# Patient Record
Sex: Male | Born: 1960 | Race: Black or African American | Hispanic: No | Marital: Married | Smoking: Current every day smoker
Health system: Southern US, Community
[De-identification: ages and names within clinical notes are randomized; demographics above are authoritative.]

## PROBLEM LIST (undated history)

## (undated) DIAGNOSIS — G8929 Other chronic pain: Secondary | ICD-10-CM

## (undated) DIAGNOSIS — I6522 Occlusion and stenosis of left carotid artery: Secondary | ICD-10-CM

## (undated) DIAGNOSIS — I10 Essential (primary) hypertension: Secondary | ICD-10-CM

## (undated) DIAGNOSIS — M1990 Unspecified osteoarthritis, unspecified site: Secondary | ICD-10-CM

## (undated) DIAGNOSIS — M545 Low back pain, unspecified: Secondary | ICD-10-CM

## (undated) DIAGNOSIS — I639 Cerebral infarction, unspecified: Secondary | ICD-10-CM

## (undated) DIAGNOSIS — Z8739 Personal history of other diseases of the musculoskeletal system and connective tissue: Secondary | ICD-10-CM

## (undated) DIAGNOSIS — J45909 Unspecified asthma, uncomplicated: Secondary | ICD-10-CM

## (undated) DIAGNOSIS — E7800 Pure hypercholesterolemia, unspecified: Secondary | ICD-10-CM

---

## 1999-09-11 ENCOUNTER — Encounter: Payer: Self-pay | Admitting: Emergency Medicine

## 1999-09-11 ENCOUNTER — Emergency Department (HOSPITAL_COMMUNITY): Admission: EM | Admit: 1999-09-11 | Discharge: 1999-09-11 | Payer: Self-pay | Admitting: Emergency Medicine

## 2001-06-11 ENCOUNTER — Emergency Department (HOSPITAL_COMMUNITY): Admission: EM | Admit: 2001-06-11 | Discharge: 2001-06-11 | Payer: Self-pay

## 2001-06-24 ENCOUNTER — Emergency Department (HOSPITAL_COMMUNITY): Admission: EM | Admit: 2001-06-24 | Discharge: 2001-06-24 | Payer: Self-pay | Admitting: Emergency Medicine

## 2001-09-20 ENCOUNTER — Encounter: Admission: RE | Admit: 2001-09-20 | Discharge: 2001-09-24 | Payer: Self-pay | Admitting: Orthopaedic Surgery

## 2010-12-29 HISTORY — PX: MEDIAL COLLATERAL LIGAMENT REPAIR, KNEE: SHX2019

## 2017-11-28 DIAGNOSIS — I639 Cerebral infarction, unspecified: Secondary | ICD-10-CM

## 2017-11-28 HISTORY — DX: Cerebral infarction, unspecified: I63.9

## 2017-12-15 ENCOUNTER — Other Ambulatory Visit: Payer: Self-pay

## 2017-12-15 ENCOUNTER — Emergency Department (HOSPITAL_COMMUNITY): Payer: Self-pay

## 2017-12-15 ENCOUNTER — Inpatient Hospital Stay (HOSPITAL_COMMUNITY)
Admission: EM | Admit: 2017-12-15 | Discharge: 2017-12-18 | DRG: 063 | Disposition: A | Discharge status: Home / Self Care | Payer: Self-pay | Attending: Neurology | Admitting: Neurology

## 2017-12-15 DIAGNOSIS — G8321 Monoplegia of upper limb affecting right dominant side: Secondary | ICD-10-CM | POA: Diagnosis present

## 2017-12-15 DIAGNOSIS — F17210 Nicotine dependence, cigarettes, uncomplicated: Secondary | ICD-10-CM | POA: Diagnosis present

## 2017-12-15 DIAGNOSIS — Z8673 Personal history of transient ischemic attack (TIA), and cerebral infarction without residual deficits: Secondary | ICD-10-CM

## 2017-12-15 DIAGNOSIS — I10 Essential (primary) hypertension: Secondary | ICD-10-CM | POA: Diagnosis present

## 2017-12-15 DIAGNOSIS — I6522 Occlusion and stenosis of left carotid artery: Secondary | ICD-10-CM | POA: Diagnosis present

## 2017-12-15 DIAGNOSIS — I63232 Cerebral infarction due to unspecified occlusion or stenosis of left carotid arteries: Secondary | ICD-10-CM

## 2017-12-15 DIAGNOSIS — I639 Cerebral infarction, unspecified: Secondary | ICD-10-CM | POA: Diagnosis present

## 2017-12-15 DIAGNOSIS — Z91013 Allergy to seafood: Secondary | ICD-10-CM

## 2017-12-15 DIAGNOSIS — I6340 Cerebral infarction due to embolism of unspecified cerebral artery: Principal | ICD-10-CM | POA: Diagnosis present

## 2017-12-15 DIAGNOSIS — R29705 NIHSS score 5: Secondary | ICD-10-CM | POA: Diagnosis present

## 2017-12-15 DIAGNOSIS — R29810 Facial weakness: Secondary | ICD-10-CM | POA: Diagnosis present

## 2017-12-15 DIAGNOSIS — I959 Hypotension, unspecified: Secondary | ICD-10-CM | POA: Diagnosis not present

## 2017-12-15 DIAGNOSIS — Z716 Tobacco abuse counseling: Secondary | ICD-10-CM

## 2017-12-15 DIAGNOSIS — R471 Dysarthria and anarthria: Secondary | ICD-10-CM | POA: Diagnosis present

## 2017-12-15 DIAGNOSIS — E785 Hyperlipidemia, unspecified: Secondary | ICD-10-CM | POA: Diagnosis present

## 2017-12-15 HISTORY — DX: Cerebral infarction, unspecified: I63.9

## 2017-12-15 HISTORY — DX: Essential (primary) hypertension: I10

## 2017-12-15 LAB — I-STAT TROPONIN, ED: TROPONIN I, POC: 0 ng/mL (ref 0.00–0.08)

## 2017-12-15 LAB — PROTIME-INR
INR: 1.07
PROTHROMBIN TIME: 13.8 s (ref 11.4–15.2)

## 2017-12-15 LAB — I-STAT CHEM 8, ED
BUN: 10 mg/dL (ref 6–20)
Calcium, Ion: 1.2 mmol/L (ref 1.15–1.40)
Chloride: 96 mmol/L — ABNORMAL LOW (ref 101–111)
Creatinine, Ser: 1.6 mg/dL — ABNORMAL HIGH (ref 0.61–1.24)
Glucose, Bld: 112 mg/dL — ABNORMAL HIGH (ref 65–99)
HCT: 45 % (ref 39.0–52.0)
Hemoglobin: 15.3 g/dL (ref 13.0–17.0)
Potassium: 3.6 mmol/L (ref 3.5–5.1)
Sodium: 136 mmol/L (ref 135–145)
TCO2: 27 mmol/L (ref 22–32)

## 2017-12-15 LAB — CBC
HCT: 41.7 % (ref 39.0–52.0)
Hemoglobin: 14.9 g/dL (ref 13.0–17.0)
MCH: 29.8 pg (ref 26.0–34.0)
MCHC: 35.7 g/dL (ref 30.0–36.0)
MCV: 83.4 fL (ref 78.0–100.0)
Platelets: 337 10*3/uL (ref 150–400)
RBC: 5 MIL/uL (ref 4.22–5.81)
RDW: 13.4 % (ref 11.5–15.5)
WBC: 15.3 10*3/uL — ABNORMAL HIGH (ref 4.0–10.5)

## 2017-12-15 LAB — APTT: aPTT: 30 s (ref 24–36)

## 2017-12-15 LAB — CBG MONITORING, ED: Glucose-Capillary: 121 mg/dL — ABNORMAL HIGH (ref 65–99)

## 2017-12-15 NOTE — ED Provider Notes (Signed)
[facility]   CSN: [id number] Arrival date & time: 12/15/17  2331     History   Chief Complaint Chief Complaint  Patient presents with  . Code Stroke    HPI Jimmy Morales is a 56 y.o. male.  HPI  Patient evaluated at 23:40 for Code Stroke.  This a 56 year old male who presents with acute onset of facial droop, dysarthria, and right upper extremity numbness.  Last seen normal 45 minutes ago.  I was emergently called to triage given concerns for a acute stroke.  Patient reports a history of heart disease and hypertension.  He was at work earlier and got home when he noted difficulty speaking and right-sided facial droop.  Patient is able to provide full history and states that he was able to finish work without any difficulties.  Level 5 caveat for acuity of condition. Code stroke initiated immediately  No past medical history on file.  Patient Active Problem List   Diagnosis Date Noted  . 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  Social History Social History   Tobacco Use  . Smoking status: Not on file  Substance Use Topics  . Alcohol use: Not on file  . Drug use: Not on file     Allergies   Patient has no allergy information on record.   Review of Systems Review of Systems  Unable to perform ROS: Acuity of condition  Neurological: Positive for facial asymmetry, speech difficulty and numbness.     Physical Exam Updated Vital Signs Wt 79.8 kg (175 lb 14.8 oz)   Physical Exam  Constitutional: He is oriented to person, place, and time. He appears well-developed and well-nourished.  ABCs intact  HENT:  Head: Normocephalic.  Right-sided facial droop noted that spares the forehead  Eyes: Pupils are equal, round, and reactive to light.  Cardiovascular: Normal rate, regular rhythm  and normal heart sounds.  No murmur heard. Pulmonary/Chest: Effort normal. No respiratory distress.  Abdominal: Soft. There is no tenderness.  Musculoskeletal: He exhibits no edema.  Neurological: He is alert and oriented to person, place, and time.  Right-sided facial droop that spares the forehead, dysarthria noted, drift noted in the right upper extremity with 4+ out of 5 grip strength and proximal muscle strength  Skin: Skin is warm and dry.  Psychiatric: He has a normal mood and affect.  Nursing note and vitals reviewed.    ED Treatments / Results  Labs (all labs ordered are listed, but only abnormal results are displayed) Labs Reviewed  CBC - Abnormal; Notable for the following components:      Result Value   WBC 15.3 (*)    All other components within normal limits  DIFFERENTIAL - Abnormal; Notable for the following components:   Neutro Abs 10.5 (*)    Monocytes Absolute 1.5 (*)    All other components within normal limits  COMPREHENSIVE METABOLIC PANEL - Abnormal; Notable for the following components:   Sodium 133 (*)    Chloride 99 (*)    Glucose, Bld 115 (*)    Creatinine, Ser 1.75 (*)    ALT 14 (*)    GFR calc non Af Amer 41 (*)    GFR calc Af Amer 48 (*)    All other components within normal limits  CBG  MONITORING, ED - Abnormal; Notable for the following components:   Glucose-Capillary 121 (*)    All other components within normal limits  I-STAT CHEM 8, ED - Abnormal; Notable for the following components:   Chloride 96 (*)    Creatinine, Ser 1.60 (*)    Glucose, Bld 112 (*)    All other components within normal limits  PROTIME-INR  APTT  HIV ANTIBODY (ROUTINE TESTING)  HEMOGLOBIN A1C  LIPID PANEL  I-STAT TROPONIN, ED  CBG MONITORING, ED    EKG  EKG Interpretation None      ED ECG REPORT   Date: 12/16/2017  Rate: 116  Rhythm: sinus tachycardia  QRS Axis: right  Intervals: normal  ST/T Wave abnormalities: normal  Conduction  Disutrbances:none  Narrative Interpretation:   Old EKG Reviewed: none available  I have personally reviewed the EKG tracing and agree with the computerized printout as noted.  Radiology Ct Head Code Stroke Wo Contrast  Result Date: 12/16/2017 CLINICAL DATA:  Code stroke. Right-sided weakness. Last seen well 09:20 p.m. EXAM: CT HEAD WITHOUT CONTRAST TECHNIQUE: Contiguous axial images were obtained from the base of the skull through the vertex without intravenous contrast. COMPARISON:  None. FINDINGS: Brain: No mass lesion or acute hemorrhage. No focal hypoattenuation of the basal ganglia or cortex to indicate infarcted tissue. No hydrocephalus or age advanced atrophy. Hypoattenuation in the left frontal lobe and left parietal lobe are consistent with old infarcts. There is bilateral basal ganglia mineralization. Vascular: No hyperdense vessel. No advanced atherosclerotic calcification of the arteries at the skull base. Skull: Normal visualized skull base, calvarium and extracranial soft tissues. Sinuses/Orbits: No sinus fluid levels or advanced mucosal thickening. No mastoid effusion. Normal orbits. ASPECTS [facility] Stroke Program Early CT Score) - Ganglionic level infarction (caudate, lentiform nuclei, internal capsule, insula, M1-M3 cortex): 7 - Supraganglionic infarction (M4-M6 cortex): 3 Total score (0-10 with 10 being normal): 10 IMPRESSION: 1. No acute hemorrhage or mass effect. 2. 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 3. ASPECTS is 10. I communicated these results directly to Dr. Laurence Slate at 11:58 pmon 12/18/2018by telephone. Electronically Signed   By: Deatra Robinson M.D.   On: 12/16/2017 00:04    Procedures Procedures (including critical care time)  CRITICAL CARE Performed by: Shon Baton   Total critical care time: 25 minutes  Critical care time was exclusive of separately billable procedures and treating other patients.  Critical care was necessary to treat or prevent imminent  or life-threatening deterioration.  Critical care was time spent personally by me on the following activities: development of treatment plan with patient and/or surrogate as well as nursing, discussions with consultants, evaluation of patient's response to treatment, examination of patient, obtaining history from patient or surrogate, ordering and performing treatments and interventions, ordering and review of laboratory studies, ordering and review of radiographic studies, pulse oximetry and re-evaluation of patient's condition.   Medications Ordered in ED Medications  alteplase (ACTIVASE) 1 mg/mL infusion 72 mg (not administered)    Followed by  0.9 %  sodium chloride infusion (not administered)   stroke: mapping our early stages of recovery book (not administered)  0.9 %  sodium chloride infusion (not administered)  acetaminophen (TYLENOL) tablet 650 mg (not administered)    Or  acetaminophen (TYLENOL) solution 650 mg (not administered)    Or  acetaminophen (TYLENOL) suppository 650 mg (not administered)  senna-docusate (Senokot-S) tablet 1 tablet (not administered)  pantoprazole (PROTONIX) injection 40 mg (not administered)  labetalol (NORMODYNE,TRANDATE) injection 10 mg (not  administered)     Initial Impression / Assessment and Plan / ED Course  I have reviewed the triage vital signs and the nursing notes.  Pertinent labs & imaging results that were available during my care of the patient were reviewed by me and considered in my medical decision making (see chart for details).     Patient presents with concerns for acute stroke.  He is well within the TPA window.  Code stroke was initiated.  Evaluated by neurology in the CT scanner.  TPA was initiated with concerns for acute stroke.  His ABCs are intact at this time.  Anticipate admission to the neuro ICU for frequent neuro checks.  Final Clinical Impressions(s) / ED Diagnoses   Final diagnoses:  Cerebrovascular accident  (CVA), unspecified mechanism [facility](HCC)    ED Discharge Orders    None       Sevastian Witczak, Mayer Maskerourtney F, MD 12/16/17 0028

## 2017-12-15 NOTE — ED Triage Notes (Addendum)
45 minutes ago, pt began experiencing right sided weakness, including clear facial droop.  No hx of stroke.  Provider notified and in triage.

## 2017-12-16 ENCOUNTER — Inpatient Hospital Stay (HOSPITAL_COMMUNITY): Payer: Self-pay

## 2017-12-16 DIAGNOSIS — I639 Cerebral infarction, unspecified: Secondary | ICD-10-CM | POA: Diagnosis present

## 2017-12-16 DIAGNOSIS — I361 Nonrheumatic tricuspid (valve) insufficiency: Secondary | ICD-10-CM

## 2017-12-16 DIAGNOSIS — I63232 Cerebral infarction due to unspecified occlusion or stenosis of left carotid arteries: Secondary | ICD-10-CM

## 2017-12-16 DIAGNOSIS — E785 Hyperlipidemia, unspecified: Secondary | ICD-10-CM

## 2017-12-16 DIAGNOSIS — F17200 Nicotine dependence, unspecified, uncomplicated: Secondary | ICD-10-CM

## 2017-12-16 DIAGNOSIS — I959 Hypotension, unspecified: Secondary | ICD-10-CM

## 2017-12-16 DIAGNOSIS — I6522 Occlusion and stenosis of left carotid artery: Secondary | ICD-10-CM

## 2017-12-16 LAB — ECHOCARDIOGRAM COMPLETE
AOASC: 37 cm
E decel time: 232 msec
EERAT: 7.97
FS: 34 % (ref 28–44)
IVS/LV PW RATIO, ED: 0.99
LA ID, A-P, ES: 33 mm
LA diam end sys: 33 mm
LA diam index: 1.64 cm/m2
LA vol A4C: 46.1 ml
LDCA: 3.8 cm2
LV E/e' medial: 7.97
LV PW d: 9.66 mm — AB (ref 0.6–1.1)
LV e' LATERAL: 12 cm/s
LVEEAVG: 7.97
LVOTD: 22 mm
MV Dec: 232
MV Peak grad: 4 mmHg
MV pk A vel: 114 m/s
MVPKEVEL: 95.6 m/s
RV LATERAL S' VELOCITY: 9.9 cm/s
RV TAPSE: 21.9 mm
TDI e' lateral: 12
TDI e' medial: 8.49
Weight: 2814.83 oz

## 2017-12-16 LAB — BASIC METABOLIC PANEL
ANION GAP: 7 (ref 5–15)
BUN: 11 mg/dL (ref 6–20)
CHLORIDE: 103 mmol/L (ref 101–111)
CO2: 25 mmol/L (ref 22–32)
Calcium: 9 mg/dL (ref 8.9–10.3)
Creatinine, Ser: 1.34 mg/dL — ABNORMAL HIGH (ref 0.61–1.24)
GFR calc non Af Amer: 57 mL/min — ABNORMAL LOW (ref >60)
Glucose, Bld: 92 mg/dL (ref 65–99)
POTASSIUM: 4 mmol/L (ref 3.5–5.1)
Sodium: 135 mmol/L (ref 135–145)

## 2017-12-16 LAB — HEMOGLOBIN A1C
Hgb A1c MFr Bld: 5.6 % (ref 4.8–5.6)
Mean Plasma Glucose: 114.02 mg/dL

## 2017-12-16 LAB — COMPREHENSIVE METABOLIC PANEL
ALBUMIN: 4 g/dL (ref 3.5–5.0)
ALK PHOS: 65 U/L (ref 38–126)
ALT: 14 U/L — AB (ref 17–63)
AST: 24 U/L (ref 15–41)
Anion gap: 9 (ref 5–15)
BILIRUBIN TOTAL: 0.9 mg/dL (ref 0.3–1.2)
BUN: 10 mg/dL (ref 6–20)
CALCIUM: 9.6 mg/dL (ref 8.9–10.3)
CO2: 25 mmol/L (ref 22–32)
CREATININE: 1.75 mg/dL — AB (ref 0.61–1.24)
Chloride: 99 mmol/L — ABNORMAL LOW (ref 101–111)
GFR calc Af Amer: 48 mL/min — ABNORMAL LOW (ref >60)
GFR calc non Af Amer: 41 mL/min — ABNORMAL LOW (ref >60)
GLUCOSE: 115 mg/dL — AB (ref 65–99)
Potassium: 3.6 mmol/L (ref 3.5–5.1)
SODIUM: 133 mmol/L — AB (ref 135–145)
Total Protein: 6.9 g/dL (ref 6.5–8.1)

## 2017-12-16 LAB — RAPID URINE DRUG SCREEN, HOSP PERFORMED
AMPHETAMINES: NOT DETECTED
BENZODIAZEPINES: NOT DETECTED
Barbiturates: NOT DETECTED
Cocaine: NOT DETECTED
OPIATES: NOT DETECTED
Tetrahydrocannabinol: NOT DETECTED

## 2017-12-16 LAB — DIFFERENTIAL
Basophils Absolute: 0 10*3/uL (ref 0.0–0.1)
Basophils Relative: 0 %
EOS ABS: 0.2 10*3/uL (ref 0.0–0.7)
Eosinophils Relative: 1 %
LYMPHS PCT: 20 %
Lymphs Abs: 3.1 10*3/uL (ref 0.7–4.0)
Monocytes Absolute: 1.5 10*3/uL — ABNORMAL HIGH (ref 0.1–1.0)
Monocytes Relative: 10 %
NEUTROS ABS: 10.5 10*3/uL — AB (ref 1.7–7.7)
NEUTROS PCT: 69 %

## 2017-12-16 LAB — HIV ANTIBODY (ROUTINE TESTING W REFLEX): HIV Screen 4th Generation wRfx: NONREACTIVE

## 2017-12-16 LAB — LIPID PANEL
CHOL/HDL RATIO: 7.5 ratio
CHOLESTEROL: 181 mg/dL (ref 0–200)
HDL: 24 mg/dL — ABNORMAL LOW (ref >40)
LDL CALC: 88 mg/dL (ref 0–99)
TRIGLYCERIDES: 343 mg/dL — AB (ref <150)
VLDL: 69 mg/dL — AB (ref 0–40)

## 2017-12-16 LAB — MRSA PCR SCREENING: MRSA by PCR: NEGATIVE

## 2017-12-16 LAB — TSH: TSH: 0.909 u[IU]/mL (ref 0.350–4.500)

## 2017-12-16 LAB — VITAMIN B12: VITAMIN B 12: 402 pg/mL (ref 180–914)

## 2017-12-16 MED ORDER — PANTOPRAZOLE SODIUM 40 MG PO TBEC
40.0000 mg | DELAYED_RELEASE_TABLET | Freq: Every day | ORAL | Status: DC
  Administered 2017-12-16 – 2017-12-18 (×3): 40 mg via ORAL
  Filled 2017-12-16 (×3): qty 1

## 2017-12-16 MED ORDER — ORAL CARE MOUTH RINSE: 15.0000 mL | Freq: Two times a day (BID) | OROMUCOSAL | Status: DC

## 2017-12-16 MED ORDER — SODIUM CHLORIDE 0.9 % IV SOLN: 50.0000 mL | Freq: Once | INTRAVENOUS | Status: DC

## 2017-12-16 MED ORDER — ACETAMINOPHEN 325 MG PO TABS: 650.0000 mg | ORAL_TABLET | ORAL | Status: DC | PRN

## 2017-12-16 MED ORDER — SODIUM CHLORIDE 0.9 % IV SOLN
INTRAVENOUS | Status: DC
  Administered 2017-12-16 – 2017-12-18 (×4): via INTRAVENOUS

## 2017-12-16 MED ORDER — ACETAMINOPHEN 160 MG/5ML PO SOLN
650.0000 mg | ORAL | Status: DC | PRN
Start: 2017-12-16 — End: 2017-12-17

## 2017-12-16 MED ORDER — ATORVASTATIN CALCIUM 80 MG PO TABS
80.0000 mg | ORAL_TABLET | Freq: Every day | ORAL | Status: DC
  Administered 2017-12-17: 80 mg via ORAL
  Filled 2017-12-16: qty 1

## 2017-12-16 MED ORDER — ACETAMINOPHEN 650 MG RE SUPP
650.0000 mg | RECTAL | Status: DC | PRN
Start: 2017-12-16 — End: 2017-12-17

## 2017-12-16 MED ORDER — SENNOSIDES-DOCUSATE SODIUM 8.6-50 MG PO TABS: 1.0000 | ORAL_TABLET | Freq: Every evening | ORAL | Status: DC | PRN

## 2017-12-16 MED ORDER — IOPAMIDOL (ISOVUE-370) INJECTION 76%
INTRAVENOUS | Status: AC
  Administered 2017-12-17: 50 mL
  Filled 2017-12-16: qty 50

## 2017-12-16 MED ORDER — LABETALOL HCL 5 MG/ML IV SOLN: 10.0000 mg | INTRAVENOUS | Status: DC | PRN

## 2017-12-16 MED ORDER — STROKE: EARLY STAGES OF RECOVERY BOOK
Freq: Once | Status: AC
  Administered 2017-12-16: 04:00:00
  Filled 2017-12-16: qty 1

## 2017-12-16 MED ORDER — PANTOPRAZOLE SODIUM 40 MG IV SOLR: 40.0000 mg | Freq: Every day | INTRAVENOUS | Status: DC

## 2017-12-16 MED ORDER — ALTEPLASE (STROKE) FULL DOSE INFUSION
0.9000 mg/kg | Freq: Once | INTRAVENOUS | Status: AC
  Administered 2017-12-16: 72 mg via INTRAVENOUS
  Filled 2017-12-16: qty 100

## 2017-12-16 NOTE — Progress Notes (Signed)
Brennan BaileyDavid Zill is a 56 y.o. Male coming from home via private vehicle with acute onset of Right arm numbness, Right side facial droop, and slurred speech. Pt LKW at 2130 when eating dinner. Pt noticed numbness approx 2250. Spouse drove pt to ED, code stroke called in triage. NIH 5, CBG 121. TPA started at 0002 after IV obtained in CT

## 2017-12-16 NOTE — Progress Notes (Signed)
  Echocardiogram 2D Echocardiogram has been performed.  Shylin Keizer G Litzy Dicker 12/16/2017, 11:28 AM

## 2017-12-16 NOTE — Progress Notes (Signed)
OT Cancellation Note  Patient Details Name: Jimmy CrumbleDavid B Morales MRN: [id number] DOB: 02/06/1961   Cancelled Treatment:    Reason Eval/Treat Not Completed: Patient not medically ready. Pt with strict bedrest orders. Will await increase in activity orders prior to initiating OT evaluation. Thank you for this referral!  Doristine Sectionharity A Bralyn Espino, MS OTR/L  Pager: [phone]   Loredana Medellin A Zarahi Fuerst 12/16/2017, 7:40 AM

## 2017-12-16 NOTE — Evaluation (Signed)
Speech Language Pathology Evaluation Patient Details Name: Jimmy CrumbleDavid B [place]de[place] MRN: [id number] DOB: 03/23/1961 Today's Date: 12/16/2017 Time: 0930-1000 SLP Time Calculation (min) (ACUTE ONLY): 30 min  Problem List:  Patient Active Problem List   Diagnosis Date Noted  . 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 Past Surgical History:  HPI:  56 y.o. male AA RH male with PMH of hypertension, tobacco use presents with sudden onset right-sided weakness and slurred speech. Received TPA. CT angiogram was not done due to elevated creatinine. However stat MRI and MRA brain was obtained which showed left  frontal infarct as well as punctate bilateral parietal infarcts. Also demonstrates older left frontal and parietal lobe infarcts.    Assessment / Plan / Recommendation Clinical Impression  Pt demosntrates a mild to moderate dysarthria and aphasia with primary receptive deficits. Pt able to repeat easily, improves intelligibility with cues for louder, overarticulated speech. Follows one step commands easily, but cannot follow prepositions and added complexity in language. Mild difficulty naming. Recommend CIR at d/c. Fill follow for functional communication.     SLP Assessment  SLP Recommendation/Assessment: Patient needs continued Speech Lanaguage Pathology Services SLP Visit Diagnosis: Dysarthria and anarthria (R47.1);Aphasia (R47.01)    Follow Up Recommendations  Inpatient Rehab    Frequency and Duration min 2x/week  2 weeks      SLP Evaluation Cognition  Overall Cognitive Status: Within Functional Limits for tasks assessed Arousal/Alertness: Awake/alert Orientation Level: Oriented to person;Oriented to place;Disoriented to time;Oriented to situation Attention: Selective;Alternating Selective Attention: Appears intact Alternating Attention: Appears intact Problem Solving: Appears intact Safety/Judgment: Appears intact        Comprehension  Auditory Comprehension Overall Auditory Comprehension: Impaired Yes/No Questions: Not tested Commands: Impaired One Step Basic Commands: 75-100% accurate Two Step Basic Commands: 50-74% accurate Multistep Basic Commands: 50-74% accurate Complex Commands: 0-24% accurate Visual Recognition/Discrimination Discrimination: Within Function Limits    Expression Verbal Expression Overall Verbal Expression: Impaired Initiation: No impairment Automatic Speech: Month of year;Counting;Social Response;Name Level of Generative/Spontaneous Verbalization: Phrase Repetition: No impairment Naming: Impairment Responsive: Not tested Confrontation: Impaired Convergent: Not tested Divergent: Not tested Verbal Errors: Aware of errors Pragmatics: No impairment Interfering Components: Speech intelligibility Effective Techniques: Phonemic cues Written Expression Dominant Hand: Right Written Expression: Not tested   Oral / Motor  Oral Motor/Sensory Function Overall Oral Motor/Sensory Function: Mild impairment Facial ROM: Reduced right;Suspected CN VII (facial) dysfunction Facial Symmetry: Abnormal symmetry right;Suspected CN VII (facial) dysfunction Facial Strength: Reduced right;Suspected CN VII (facial) dysfunction Lingual ROM: Within Functional Limits;Other (Comment)(decreased lingual coordination/speed) Lingual Symmetry: Within Functional Limits Lingual Strength: Within Functional Limits Lingual Sensation: Within Functional Limits Velum: Within Functional Limits Mandible: Within Functional Limits Motor Speech Overall Motor Speech: Impaired Respiration: Within functional limits Phonation: Normal Resonance: Within functional limits Articulation: Impaired Level of Impairment: Phrase Intelligibility: Intelligibility reduced Word: 50-74% accurate Phrase: 50-74% accurate Motor Planning: Witnin functional limits Motor Speech Errors: Aware   GO                   Harlon DittyBonnie  Pati Thinnes, MA CCC-SLP [phone]  Claudine MoutonDeBlois, Coleson Kant Caroline 12/16/2017, 10:12 AM

## 2017-12-16 NOTE — Progress Notes (Signed)
Pharmacist Code Stroke Response  Notified to mix tPA at 2352 on 12/15/17 by Dr. Laurence SlateAroor Delivered tPA to RN at 2358 on 12/15/17  Abran DukeLedford, Jahson Emanuele 12/16/17 12:02 AM

## 2017-12-16 NOTE — Plan of Care (Signed)
Will continue to monitor for s/s of aspiration.

## 2017-12-16 NOTE — Progress Notes (Signed)
Rehab Admissions Coordinator Note:  Patient was screened by Trish MageLogue, Brailyn Delman M for appropriateness for an Inpatient Acute Rehab Consult. Noted SLP recommending inpatient rehab.  Will await PT/OT evaluations and recommendations to determine most appropriate rehab venue.  Trish MageLogue, Adil Tugwell M 12/16/2017, 3:03 PM  I can be reached at [phone].

## 2017-12-16 NOTE — H&P (Addendum)
Chief Complaint: Left side weakness  History obtained from: Patient and Chart     HPI:                                                                                                                                       Jimmy Morales is an 56 y.o. male AA RH male with PMH of hypertension, tobacco use presents with sudden onset right-sided weakness and slurred speech.  Patient recalls coming back from work at 9:20 PM where he was perfectly fine. Somewhere around 10:30 to 11 PM after the patient had dinner he noticed his speech became slurred and felt numbness and weakness of his right side. His spouse drove him to the hospital and on triage was stroke alerted. His blood pressure was 120 systolic. His NIHSS was 5. 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 Patient receive TPA at 12:02 AM  Date last known well: 12/15/2017 Time last known well: 10:50 PM tPA Given: yes NIHSS: 5 Baseline MRS 0  PMH Hypertension   FH No strokes in family at young age  SH Current smoker, denies illicit drugs   Allergies:  Allergies  Allergen Reactions  . Shellfish Allergy Shortness Of Breath and Swelling    Medications:                                                                                                                        I reviewed home medications   ROS:                                                                                                                                     14 systems reviewed and negative except above    Examination:  General: Appears well-developed and well-nourished.  Psych: Affect appropriate to situation Eyes: No scleral injection HENT: No OP obstrucion Head: Normocephalic.  Cardiovascular: Normal rate and regular rhythm.  Respiratory: Effort normal and breath sounds normal to anterior ascultation GI:  Soft.  No distension. There is no tenderness.  Skin: WDI   Neurological Examination Mental Status: Alert, oriented, thought content appropriate.  Speech fluent without evidence of aphasia. Dysarthric Able to follow 3 step commands without difficulty. Cranial Nerves: II: Visual fields grossly normal,  III,IV, VI: ptosis not present, extra-ocular motions intact bilaterally, pupils equal, round, reactive to light and accommodation V,VII: Right lower facial droop VIII: hearing normal bilaterally IX,X: uvula rises symmetrically XI: bilateral shoulder shrug XII: midline tongue extension Motor: Right : Upper extremity   4/5    Left:     Upper extremity   5/5  Lower extremity   4+/5     Lower extremity   5/5 Tone and bulk:normal tone throughout; no atrophy noted Sensory: Reduced sensation to light touch and temperature over the face, arm and leg on the right side Deep Tendon Reflexes: 2+ and symmetric throughout Plantars: Right: downgoing   Left: downgoing Cerebellar: normal finger-to-nose, normal rapid alternating movements and normal heel-to-shin test Gait: normal gait and station     Lab Results: Basic Metabolic Panel: Recent Labs  Lab 12/15/17 2335 12/15/17 2351  NA 133* 136  K 3.6 3.6  CL 99* 96*  CO2 25  --   GLUCOSE 115* 112*  BUN 10 10  CREATININE 1.75* 1.60*  CALCIUM 9.6  --     CBC: Recent Labs  Lab 12/15/17 2335 12/15/17 2351  WBC 15.3*  --   NEUTROABS 10.5*  --   HGB 14.9 15.3  HCT 41.7 45.0  MCV 83.4  --   PLT 337  --     Coagulation Studies: Recent Labs    12/15/17 2335  LABPROT 13.8  INR 1.07    Imaging: Mr Maxine GlennMra Neck Wo Contrast  Result Date: 12/16/2017 CLINICAL DATA:  Acute onset facial droop, dysarthria and right upper extremity numbness. EXAM: MRI HEAD WITHOUT CONTRAST MRA HEAD WITHOUT CONTRAST MRA NECK WITHOUT CONTRAST TECHNIQUE: Multiplanar, multiecho pulse sequences of the brain and surrounding structures were obtained without  intravenous contrast. Angiographic images of the Circle of Willis were obtained using MRA technique without intravenous contrast. Angiographic images of the neck were obtained using MRA technique without intravenous contrast. Carotid stenosis measurements (when applicable) are obtained utilizing NASCET criteria, using the distal internal carotid diameter as the denominator. COMPARISON:  Head CT 12/15/2017 FINDINGS: MRI HEAD FINDINGS Brain: The midline structures are normal. There is a small focus of diffusion restriction pain in the peripheral left frontal lobe, along the precentral gyrus. There are additional punctate foci within both superior parietal lobes. There are old infarcts of the left frontal lobe left parietal lobe. No mass lesion. No chronic microhemorrhage or cerebral amyloid angiopathy. No hydrocephalus, age advanced atrophy or lobar predominant volume loss. No dural abnormality or extra-axial collection. Skull and upper cervical spine: The visualized skull base, calvarium, upper cervical spine and extracranial soft tissues are normal. Sinuses/Orbits: No fluid levels or advanced mucosal thickening. No mastoid effusion. Normal orbits. MRA HEAD FINDINGS Intracranial internal carotid arteries: Normal. Anterior cerebral arteries: The azygos variant anterior cerebral artery, arising from the left A1 segment. There is a diminutive right A1 segment. Middle cerebral arteries: There is a short segment focal stenosis of the distal M 1 segment of the left middle cerebral  artery. The right MCA is normal. Posterior communicating arteries: Present bilaterally. Posterior cerebral arteries: Normal. Basilar artery: Normal. Vertebral arteries: Codominant. Normal. Superior cerebellar arteries: Normal. Anterior inferior cerebellar arteries: Normal. Posterior inferior cerebellar arteries: Normal. MRA NECK FINDINGS Right carotid system: Normal course and caliber without stenosis or evidence of dissection. Left carotid  system: There is loss of the normal flow related enhancement at the origin of the left internal carotid artery, extending over a short segment. There is present flow related enhancement of the distal left ICA. Vertebral arteries: Codominant. Vertebral artery origins are normal. Vertebral arteries are normal in course and caliber to the vertebrobasilar confluence without stenosis or evidence of dissection. IMPRESSION: 1. Multiple punctate foci of acute ischemia within the left frontal lobe and bilateral superior parietal lobes. The largest area of ischemia is along the left precentral gyrus. No hemorrhage or mass effect. 2. Loss of the normal flow related enhancement is of the proximal left internal carotid artery, likely indicating occlusion or critical stenosis. MR angiography may overestimate the degree of stenosis. CT angiography or conventional angiography may provide more accurate assessment. 3. 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 Critical Value/emergent results were called by telephone at the time of interpretation on 12/16/2017 at 3:17 am to Dr. Arther Dames , who verbally acknowledged these results. Electronically Signed   By: Deatra Robinson M.D.   On: 12/16/2017 03:19   Mr Brain Wo Contrast  Result Date: 12/16/2017 CLINICAL DATA:  Acute onset facial droop, dysarthria and right upper extremity numbness. EXAM: MRI HEAD WITHOUT CONTRAST MRA HEAD WITHOUT CONTRAST MRA NECK WITHOUT CONTRAST TECHNIQUE: Multiplanar, multiecho pulse sequences of the brain and surrounding structures were obtained without intravenous contrast. Angiographic images of the Circle of Willis were obtained using MRA technique without intravenous contrast. Angiographic images of the neck were obtained using MRA technique without intravenous contrast. Carotid stenosis measurements (when applicable) are obtained utilizing NASCET criteria, using the distal internal carotid diameter as the denominator. COMPARISON:  Head CT  12/15/2017 FINDINGS: MRI HEAD FINDINGS Brain: The midline structures are normal. There is a small focus of diffusion restriction pain in the peripheral left frontal lobe, along the precentral gyrus. There are additional punctate foci within both superior parietal lobes. There are old infarcts of the left frontal lobe left parietal lobe. No mass lesion. No chronic microhemorrhage or cerebral amyloid angiopathy. No hydrocephalus, age advanced atrophy or lobar predominant volume loss. No dural abnormality or extra-axial collection. Skull and upper cervical spine: The visualized skull base, calvarium, upper cervical spine and extracranial soft tissues are normal. Sinuses/Orbits: No fluid levels or advanced mucosal thickening. No mastoid effusion. Normal orbits. MRA HEAD FINDINGS Intracranial internal carotid arteries: Normal. Anterior cerebral arteries: The azygos variant anterior cerebral artery, arising from the left A1 segment. There is a diminutive right A1 segment. Middle cerebral arteries: There is a short segment focal stenosis of the distal M 1 segment of the left middle cerebral artery. The right MCA is normal. Posterior communicating arteries: Present bilaterally. Posterior cerebral arteries: Normal. Basilar artery: Normal. Vertebral arteries: Codominant. Normal. Superior cerebellar arteries: Normal. Anterior inferior cerebellar arteries: Normal. Posterior inferior cerebellar arteries: Normal. MRA NECK FINDINGS Right carotid system: Normal course and caliber without stenosis or evidence of dissection. Left carotid system: There is loss of the normal flow related enhancement at the origin of the left internal carotid artery, extending over a short segment. There is present flow related enhancement of the distal left ICA. Vertebral arteries: Codominant. Vertebral artery origins are  normal. Vertebral arteries are normal in course and caliber to the vertebrobasilar confluence without stenosis or evidence of  dissection. IMPRESSION: 1. Multiple punctate foci of acute ischemia within the left frontal lobe and bilateral superior parietal lobes. The largest area of ischemia is along the left precentral gyrus. No hemorrhage or mass effect. 2. Loss of the normal flow related enhancement is of the proximal left internal carotid artery, likely indicating occlusion or critical stenosis. MR angiography may overestimate the degree of stenosis. CT angiography or conventional angiography may provide more accurate assessment. 3. 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 Critical Value/emergent results were called by telephone at the time of interpretation on 12/16/2017 at 3:17 am to Dr. Arther Dames , who verbally acknowledged these results. Electronically Signed   By: Deatra Robinson M.D.   On: 12/16/2017 03:19   Mr Maxine Glenn Head Wo Contrast  Result Date: 12/16/2017 CLINICAL DATA:  Acute onset facial droop, dysarthria and right upper extremity numbness. EXAM: MRI HEAD WITHOUT CONTRAST MRA HEAD WITHOUT CONTRAST MRA NECK WITHOUT CONTRAST TECHNIQUE: Multiplanar, multiecho pulse sequences of the brain and surrounding structures were obtained without intravenous contrast. Angiographic images of the Circle of Willis were obtained using MRA technique without intravenous contrast. Angiographic images of the neck were obtained using MRA technique without intravenous contrast. Carotid stenosis measurements (when applicable) are obtained utilizing NASCET criteria, using the distal internal carotid diameter as the denominator. COMPARISON:  Head CT 12/15/2017 FINDINGS: MRI HEAD FINDINGS Brain: The midline structures are normal. There is a small focus of diffusion restriction pain in the peripheral left frontal lobe, along the precentral gyrus. There are additional punctate foci within both superior parietal lobes. There are old infarcts of the left frontal lobe left parietal lobe. No mass lesion. No chronic microhemorrhage or cerebral amyloid  angiopathy. No hydrocephalus, age advanced atrophy or lobar predominant volume loss. No dural abnormality or extra-axial collection. Skull and upper cervical spine: The visualized skull base, calvarium, upper cervical spine and extracranial soft tissues are normal. Sinuses/Orbits: No fluid levels or advanced mucosal thickening. No mastoid effusion. Normal orbits. MRA HEAD FINDINGS Intracranial internal carotid arteries: Normal. Anterior cerebral arteries: The azygos variant anterior cerebral artery, arising from the left A1 segment. There is a diminutive right A1 segment. Middle cerebral arteries: There is a short segment focal stenosis of the distal M 1 segment of the left middle cerebral artery. The right MCA is normal. Posterior communicating arteries: Present bilaterally. Posterior cerebral arteries: Normal. Basilar artery: Normal. Vertebral arteries: Codominant. Normal. Superior cerebellar arteries: Normal. Anterior inferior cerebellar arteries: Normal. Posterior inferior cerebellar arteries: Normal. MRA NECK FINDINGS Right carotid system: Normal course and caliber without stenosis or evidence of dissection. Left carotid system: There is loss of the normal flow related enhancement at the origin of the left internal carotid artery, extending over a short segment. There is present flow related enhancement of the distal left ICA. Vertebral arteries: Codominant. Vertebral artery origins are normal. Vertebral arteries are normal in course and caliber to the vertebrobasilar confluence without stenosis or evidence of dissection. IMPRESSION: 1. Multiple punctate foci of acute ischemia within the left frontal lobe and bilateral superior parietal lobes. The largest area of ischemia is along the left precentral gyrus. No hemorrhage or mass effect. 2. Loss of the normal flow related enhancement is of the proximal left internal carotid artery, likely indicating occlusion or critical stenosis. MR angiography may overestimate  the degree of stenosis. CT angiography or conventional angiography may provide more accurate assessment. 3.  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 Critical Value/emergent results were called by telephone at the time of interpretation on 12/16/2017 at 3:17 am to Dr. Arther Dames , who verbally acknowledged these results. Electronically Signed   By: Deatra Robinson M.D.   On: 12/16/2017 03:19   Ct Head Code Stroke Wo Contrast  Result Date: 12/16/2017 CLINICAL DATA:  Code stroke. Right-sided weakness. Last seen well 09:20 p.m. EXAM: CT HEAD WITHOUT CONTRAST TECHNIQUE: Contiguous axial images were obtained from the base of the skull through the vertex without intravenous contrast. COMPARISON:  None. FINDINGS: Brain: No mass lesion or acute hemorrhage. No focal hypoattenuation of the basal ganglia or cortex to indicate infarcted tissue. No hydrocephalus or age advanced atrophy. Hypoattenuation in the left frontal lobe and left parietal lobe are consistent with old infarcts. There is bilateral basal ganglia mineralization. Vascular: No hyperdense vessel. No advanced atherosclerotic calcification of the arteries at the skull base. Skull: Normal visualized skull base, calvarium and extracranial soft tissues. Sinuses/Orbits: No sinus fluid levels or advanced mucosal thickening. No mastoid effusion. Normal orbits. ASPECTS [facility] Stroke Program Early CT Score) - Ganglionic level infarction (caudate, lentiform nuclei, internal capsule, insula, M1-M3 cortex): 7 - Supraganglionic infarction (M4-M6 cortex): 3 Total score (0-10 with 10 being normal): 10 IMPRESSION: 1. No acute hemorrhage or mass effect. 2. 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 3. ASPECTS is 10. I communicated these results directly to Dr. Laurence Slate at 11:58 pmon 12/18/2018by telephone. Electronically Signed   By: Deatra Robinson M.D.   On: 12/16/2017 00:04     ASSESSMENT AND PLAN   56 y.o. male AA RH male with PMH of hypertension, tobacco use presents  with sudden onset right-sided weakness and slurred speech. Received TPA. CT angiogram was not done due to elevated creatinine. However stat MRI and MRA brain was obtained which showed left  frontal infarct as well as punctate bilateral parietal infarcts. Also demonstrates older left frontal and parietal lobe infarcts.  MRA however demonstrated a focal area of occlusion/  critical stenosis in the proximal left ICA (more likely critical stenosis given reconstitution in the terminal ICA and MR angiogram tends to over read) with no occlusion/stenosis in left MCA.    Acute Ischemic Stroke Left ICA high grade stenosis/occlusion   Acute Ischemic Stroke  #MRI brain and MRA performed with results as above #Transthoracic Echo  # Hold aspirin until 24-hour repeat CT scan #Start or continue Atorvastatin 80 mg/other high intensity statin # BP goal: permissive HTN upto 185 systolic # HBAIC and Lipid profile # Telemetry monitoring # Frequent neuro checks # NPO until passes stroke swallow screen  Left ICA high grade stenosis/occlusion Carotid US  Also consider diagnostic cerebral angiogram and CEA vs stenting if not completely occluded  # HTN Permissive hypertension  PRN labetalol ordered  #Acute Kidney injury vs CKD Baseline creatine not known IV fluids  #Tobacco use Smoking cessation   DVT ppx; SCDs  I was involved in the care this neurologically critical ill patient who presented with acute ischemic stroke including diagnosis and  treatment including administration of TPA. I spent 70 minutes in the care of this patient.   Please page stroke NP  Or  PA  Or MD from 8am -4 pm  as this patient from this time will be  followed by the stroke.   You can look them up on [email or web address]  Password [facility]   Sushanth Aroor [facility] Pager Number [phone]

## 2017-12-16 NOTE — ED Notes (Signed)
Patient place in triage 1, notified RN & Dr.Horton.

## 2017-12-16 NOTE — Progress Notes (Addendum)
STROKE TEAM PROGRESS NOTE   SUBJECTIVE (INTERVAL HISTORY) His son is at the bedside. Neuro stable, still has RUE mild weakness and right facial droop with severe dysarthria. Cre improved, will have CTA neck as well as CUS. BP on the low side, increase IVF and keep bed rest.   OBJECTIVE Temp:  [98.2 F (36.8 C)-98.6 F (37 C)] 98.2 F (36.8 C) (12/19 0800) Pulse Rate:  [63-93] 63 (12/19 1100) Cardiac Rhythm: Normal sinus rhythm (12/19 0800) Resp:  [15-23] 20 (12/19 1100) BP: (86-128)/(59-88) 95/64 (12/19 1100) SpO2:  [94 %-100 %] 99 % (12/19 1100) Weight:  [175 lb 14.8 oz (79.8 kg)] 175 lb 14.8 oz (79.8 kg) (12/19 0001)  Recent Labs  Lab 12/15/17 2339  GLUCAP 121*   Recent Labs  Lab 12/15/17 2335 12/15/17 2351 12/16/17 0930  NA 133* 136 135  K 3.6 3.6 4.0  CL 99* 96* 103  CO2 25  --  25  GLUCOSE 115* 112* 92  BUN 10 10 11   CREATININE 1.75* 1.60* 1.34*  CALCIUM 9.6  --  9.0   Recent Labs  Lab 12/15/17 2335  AST 24  ALT 14*  ALKPHOS 65  BILITOT 0.9  PROT 6.9  ALBUMIN 4.0   Recent Labs  Lab 12/15/17 2335 12/15/17 2351  WBC 15.3*  --   NEUTROABS 10.5*  --   HGB 14.9 15.3  HCT 41.7 45.0  MCV 83.4  --   PLT 337  --    No results for input(s): CKTOTAL, CKMB, CKMBINDEX, TROPONINI in the last 168 hours. Recent Labs    12/15/17 2335  LABPROT 13.8  INR 1.07   No results for input(s): COLORURINE, LABSPEC, PHURINE, GLUCOSEU, HGBUR, BILIRUBINUR, KETONESUR, PROTEINUR, UROBILINOGEN, NITRITE, LEUKOCYTESUR in the last 72 hours.  Invalid input(s): APPERANCEUR     Component Value Date/Time   CHOL 181 12/16/2017 0312   TRIG 343 (H) 12/16/2017 0312   HDL 24 (L) 12/16/2017 0312   CHOLHDL 7.5 12/16/2017 0312   VLDL 69 (H) 12/16/2017 0312   LDLCALC 88 12/16/2017 0312   Lab Results  Component Value Date   HGBA1C 5.6 12/16/2017   No results found for: LABOPIA, COCAINSCRNUR, LABBENZ, AMPHETMU, THCU, LABBARB  No results for input(s): ETH in the last 168 hours.  I  have personally reviewed the radiological images below and agree with the radiology interpretations.  Mri Mra head and Neck Wo Contrast 12/16/2017 IMPRESSION: 1. Multiple punctate foci of acute ischemia within the left frontal lobe and bilateral superior parietal lobes. The largest area of ischemia is along the left precentral gyrus. No hemorrhage or mass effect. 2. Loss of the normal flow related enhancement is of the proximal left internal carotid artery, likely indicating occlusion or critical stenosis. MR angiography may overestimate the degree of stenosis. CT angiography or conventional angiography may provide more accurate assessment. 3. 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   Ct Head Code Stroke Wo Contrast 12/16/2017 IMPRESSION: 1. No acute hemorrhage or mass effect. 2. 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 3. ASPECTS is 10.   Carotid Doppler  pending  TTE pending  CTA neck pending   PHYSICAL EXAM  Temp:  [98.2 F (36.8 C)-98.6 F (37 C)] 98.2 F (36.8 C) (12/19 0800) Pulse Rate:  [63-93] 63 (12/19 1100) Resp:  [15-23] 20 (12/19 1100) BP: (86-128)/(59-88) 95/64 (12/19 1100) SpO2:  [94 %-100 %] 99 % (12/19 1100) Weight:  [175 lb 14.8 oz (79.8 kg)] 175 lb 14.8 oz (79.8 kg) (12/19 0001)  General - Well nourished, well developed, in no apparent distress.  Ophthalmologic - fundi not visualized due to noncooperation.  Cardiovascular - Regular rate and rhythm with no murmur.  Mental Status -  Level of arousal and orientation to place, and person were intact, but not to time. Language including expression, naming, repetition, comprehension was assessed and found intact, moderate to severe dysarthria  Cranial Nerves II - XII - II - Visual field intact OU. III, IV, VI - Extraocular movements intact. V - Facial sensation intact bilaterally. VII - right facial droop. VIII - Hearing & vestibular intact bilaterally. X - Palate elevates symmetrically, moderate to severe  dysarthria. XI - Chin turning & shoulder shrug intact bilaterally. XII - Tongue protrusion intact.  Motor Strength - The patient's strength was normal in all extremities except RUE 4/5 deltoid, bicep, tricep, but 3-/5 finger grip and pronator drift was present on the right.  Bulk was normal and fasciculations were absent.   Motor Tone - Muscle tone was assessed at the neck and appendages and was normal.  Reflexes - The patient's reflexes were symmetrical in all extremities and he had no pathological reflexes.  Sensory - Light touch, temperature/pinprick were assessed and were symmetrical.    Coordination - The patient had normal movements in the hands with no ataxia or dysmetria.  Tremor was absent.  Gait and Station - deferred.   ASSESSMENT/PLAN Mr. Jimmy Morales is a 56 y.o. male with history of HTN and smoker admitted for right sided weakness and slurry speech. TPA given.    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 Yes; Fluid consistency: Thin   No antithrombotic prior to admission, now on No antithrombotic within 24h of tPA  Patient counseled to be compliant with his antithrombotic medications  Ongoing aggressive stroke risk factor management  Therapy recommendations:  pending  Disposition:  Pending  Left ICA stenosis  Confirmed on MRA neck  Will do CTA neck  CUS pending  If confirmed, may consider VVS consultation or stenting  Avoid hypotension  Hypotension BP at low  side Permissive hypertension (OK if <180/105) for 24-48 hours post stroke and then gradually normalized within 5-7 days.  Long term BP goal 130-150 due to left ICA stenosis before revascularization  On IVF @ 100  Keep bed rest for today  Hyperlipidemia  Home meds:  none   LDL 88, goal < 70  Now on lipitor 80mg   Continue statin at discharge  Tobacco abuse  Current smoker - 1PPD  Smoking cessation counseling provided  Pt is willing to quit  Other Stroke Risk Factors  Advanced age  Hx of stroke in imaging - left MCA parietal infarct  Other Active Problems  Elevated Cre - 1.60->1.34  Hospital day # 0  This patient is critically ill due to stroke, left carotid stenosis, s/p tPA, smoker and at significant risk of neurological worsening, death form recurrent large infarct, hemorrhagic conversion, seizure, cerebral edema. This patient's care requires constant monitoring of  vital signs, hemodynamics, respiratory and cardiac monitoring, review of multiple databases, neurological assessment, discussion with family, other specialists and medical decision making of high complexity. I had long discussion with pt and son at bedside, updated pt current condition, treatment plan and potential prognosis. They expressed understanding and appreciation. I spent 40 minutes of neurocritical care time in the care of this patient.   Marvel Plan, MD PhD Stroke Neurology 12/16/2017 12:11 PM    To contact Stroke Continuity provider, please refer to [email or web address]. After hours, contact General Neurology

## 2017-12-16 NOTE — Evaluation (Signed)
Clinical/Bedside Swallow Evaluation Patient Details  Name: Tomasita CrumbleDavid B Quackenbush MRN: [id number] Date of Birth: 05/08/1961  Today's Date: 12/16/2017 Time: SLP Start Time (ACUTE ONLY): [id number] SLP Stop Time (ACUTE ONLY): 1000 SLP Time Calculation (min) (ACUTE ONLY): 33 min  Past Medical History: No past medical history on file.  HPI:  56 y.o. male AA RH male with PMH of hypertension, tobacco use presents with sudden onset right-sided weakness and slurred speech. Received TPA. CT angiogram was not done due to elevated creatinine. However stat MRI and MRA brain was obtained which showed left  frontal infarct as well as punctate bilateral parietal infarcts. Also demonstrates older left frontal and parietal lobe infarcts.    Assessment / Plan / Recommendation Clinical Impression  Pt demonstrates normal swallow function. Mild right labial weakness and decreased lingual coordination for speech does not impact swallow. Pt able to consume 8 oz of thin liquids via straw with rapid intake, often continuous without signs of aspiration. Recommend pt initiate a regular diet and thin liquids. Wife will bring dentures. No f/u needed for swallowing, see next note.       Aspiration Risk  Mild aspiration risk    Diet Recommendation Regular;Thin liquid   Liquid Administration via: Cup;Straw Medication Administration: Whole meds with liquid Supervision: Patient able to self feed;Comment(may need assist given dominant hand weakness) Postural Changes: Seated upright at 90 degrees    Other  Recommendations Oral Care Recommendations: Patient independent with oral care   Follow up Recommendations        Frequency and Duration            Prognosis        Swallow Study   General HPI: 56 y.o. male AA RH male with PMH of hypertension, tobacco use presents with sudden onset right-sided weakness and slurred speech. Received TPA. CT angiogram was not done due to elevated creatinine. However stat MRI and MRA brain was  obtained which showed left  frontal infarct as well as punctate bilateral parietal infarcts. Also demonstrates older left frontal and parietal lobe infarcts.  Type of Study: Bedside Swallow Evaluation Previous Swallow Assessment: none Diet Prior to this Study: NPO Temperature Spikes Noted: No Respiratory Status: Room air History of Recent Intubation: No Behavior/Cognition: Alert;Cooperative;Requires cueing Oral Cavity Assessment: Within Functional Limits Oral Care Completed by SLP: No Oral Cavity - Dentition: Poor condition;Missing dentition;Dentures, not available Vision: Functional for self-feeding Self-Feeding Abilities: Able to feed self Patient Positioning: Upright in bed Baseline Vocal Quality: Normal Volitional Cough: Strong Volitional Swallow: Able to elicit    Oral/Motor/Sensory Function Overall Oral Motor/Sensory Function: Mild impairment Facial ROM: Reduced right;Suspected CN VII (facial) dysfunction Facial Symmetry: Abnormal symmetry right;Suspected CN VII (facial) dysfunction Facial Strength: Reduced right;Suspected CN VII (facial) dysfunction Lingual ROM: Within Functional Limits;Other (Comment)(decreased coordination) Lingual Symmetry: Within Functional Limits Lingual Strength: Within Functional Limits Lingual Sensation: Within Functional Limits Velum: Within Functional Limits Mandible: Within Functional Limits   Ice Chips     Thin Liquid Thin Liquid: Within functional limits Presentation: Cup;Straw;Self Fed    Nectar Thick Nectar Thick Liquid: Not tested   Honey Thick Honey Thick Liquid: Not tested   Puree Puree: Within functional limits Presentation: Self Fed;Spoon   Solid   GO   Solid: Within functional limits Presentation: Self Fed       Harlon DittyBonnie Blakeleigh Domek, MA CCC-SLP [phone]  Claudine MoutonDeBlois, Micaila Ziemba Caroline 12/16/2017,10:03 AM

## 2017-12-16 NOTE — Progress Notes (Signed)
PT Cancellation Note  Patient Details Name: Jimmy CrumbleDavid B Morales MRN: [id number] DOB: 05/05/1961   Cancelled Treatment:    Reason Eval/Treat Not Completed: Patient not medically ready. Pt with strict bedrest orders with TPA. Will await increased activity orders.   Angelina OkCary W Maycok 12/16/2017, 8:43 AM Skip Mayerary Padraic Marinos PT [phone]

## 2017-12-17 ENCOUNTER — Inpatient Hospital Stay (HOSPITAL_COMMUNITY): Payer: Self-pay

## 2017-12-17 ENCOUNTER — Other Ambulatory Visit (HOSPITAL_COMMUNITY): Payer: Self-pay

## 2017-12-17 DIAGNOSIS — I6522 Occlusion and stenosis of left carotid artery: Secondary | ICD-10-CM

## 2017-12-17 LAB — RPR: RPR: NONREACTIVE

## 2017-12-17 MED ORDER — ENOXAPARIN SODIUM 40 MG/0.4ML ~~LOC~~ SOLN
40.0000 mg | Freq: Every day | SUBCUTANEOUS | Status: DC
  Administered 2017-12-17 – 2017-12-18 (×2): 40 mg via SUBCUTANEOUS
  Filled 2017-12-17 (×2): qty 0.4

## 2017-12-17 MED ORDER — ASPIRIN EC 325 MG PO TBEC
325.0000 mg | DELAYED_RELEASE_TABLET | Freq: Every day | ORAL | Status: DC
  Administered 2017-12-17 – 2017-12-18 (×2): 325 mg via ORAL
  Filled 2017-12-17 (×2): qty 1

## 2017-12-17 NOTE — H&P (View-Only) (Signed)
[facility] [place] CONSULT NOTE   MRN : [id number]  Reason for Consult: Carotid stenosis Referring Physician: Dr. Xu  History of Present Illness: 56 Y/O male with sudden on set of right right UE weakness and slurred speech due to right sided facial droop was brought to the ED on 12/15/2017.  MRI and MRA brain was obtained which showed left  frontal infarct as well as punctate bilateral parietal infarcts.  CTA of the neck revealed left ICA stenosis of 90%.  The patient did receive TPA at 12 am  We are consult for carotid stenosis.  Past medical history includes HTN managed with Lisinopril/HCTZ, hyperlipidemia that was managed with Crestor, but it was discontinued reason not known.  He does take a daily 81 mg aspirin.  He reports no history of CVA or CAD.  He is a chronic tobacco abuser.         Current Facility-Administered Medications  Medication Dose Route Frequency Provider Last Rate Last Dose  . 0.9 %  sodium chloride infusion  50 mL Intravenous Once Aroor, Sushanth R, MD   Stopped at 12/16/17 0230  . 0.9 %  sodium chloride infusion   Intravenous Continuous Xu, Jindong, MD 100 mL/hr at 12/17/17 0700    . acetaminophen (TYLENOL) tablet 650 mg  650 mg Oral Q4H PRN Aroor, Sushanth R, MD       Or  . acetaminophen (TYLENOL) solution 650 mg  650 mg Per Tube Q4H PRN Aroor, Sushanth R, MD       Or  . acetaminophen (TYLENOL) suppository 650 mg  650 mg Rectal Q4H PRN Aroor, Sushanth R, MD      . atorvastatin (LIPITOR) tablet 80 mg  80 mg Oral q1800 Xu, Jindong, MD      . labetalol (NORMODYNE,TRANDATE) injection 10 mg  10 mg Intravenous Q2H PRN Xu, Jindong, MD      . pantoprazole (PROTONIX) EC tablet 40 mg  40 mg Oral Daily Xu, Jindong, MD   40 mg at 12/17/17 0937  . senna-docusate (Senokot-S) tablet 1 tablet  1 tablet Oral QHS PRN Aroor, Sushanth R, MD        Pt meds include: Statin :No Betablocker: No ASA: Yes Other anticoagulants/antiplatelets: none  No past  medical history on file.    Social History Social History   Tobacco Use  . Smoking status: Not on file  Substance Use Topics  . Alcohol use: Not on file  . Drug use: Not on file    Family History No family history on file.  Unknown  Allergies  Allergen Reactions  . Shellfish Allergy Shortness Of Breath and Swelling     REVIEW OF SYSTEMS  General: [ ] Weight loss, [ ] Fever, [ ] chills Neurologic: [ ] Dizziness, [ ] Blackouts, [ ] Seizure [ ] Stroke, [ ] "Mini stroke", [ ] Slurred speech, [ ] Temporary blindness; [ ] weakness in arms or legs, [ ] Hoarseness [ ] Dysphagia Cardiac: [ ] Chest pain/pressure, [ ] Shortness of breath at rest [ ] Shortness of breath with exertion, [ ] Atrial fibrillation or irregular heartbeat  Vascular: [ ] Pain in legs with walking, [ ] Pain in legs at rest, [ ] Pain in legs at night,  [ ] Non-healing ulcer, [ ] Blood clot in vein/DVT,   Pulmonary: [ ] Home oxygen, [ ] Productive cough, [ ] Coughing up blood, [ ] Asthma,  [ ] Wheezing [ ] COPD Musculoskeletal:  [ ]   Arthritis, [ ] Low back pain, [ ] Joint pain Hematologic: [ ] Easy Bruising, [ ] Anemia; [ ] Hepatitis Gastrointestinal: [ ] Blood in stool, [ ] Gastroesophageal Reflux/heartburn, Urinary: [ ] chronic Kidney disease, [ ] on HD - [ ] MWF or [ ] TTHS, [ ] Burning with urination, [ ] Difficulty urinating Skin: [ ] Rashes, [ ] Wounds Psychological: [ ] Anxiety, [ ] Depression  Physical Examination Vitals:   12/17/17 0600 12/17/17 0700 12/17/17 0800 12/17/17 0900  BP: 108/71 104/70 (!) 91/54 103/68  Pulse: 71 63 (!) 57 (!) 58  Resp: 17 18 18 18  Temp:   98.2 F (36.8 C)   TempSrc:   Oral   SpO2: 99% 99% 97% 98%  Weight:       There is no height or weight on file to calculate BMI.  General:  WDWN in NAD HENT: WNL Eyes: Pupils equal Pulmonary: normal non-labored breathing , without Rales, rhonchi,  wheezing Cardiac: RRR, without  Murmurs, rubs or gallops;  carotid  bruits Abdomen: soft, NT, no masses Skin: no rashes, ulcers noted;  no Gangrene , no cellulitis; no open wounds;   Vascular Exam/Pulses:Palpable radial, femoral pulses B.  Non palpable pedal pulses, active range of motion B feet intact and sensation intact and equal.  Feet warm to touch and well perfused.   Musculoskeletal: no muscle wasting or atrophy; no edema  Neurologic: A&O X 3; Appropriate Affect ;  SENSATION: normal; MOTOR FUNCTION: 5/5 Symmetric B LE, right UE 0/5 grip, left UE full range of motion grip 5/5 Speech is slurred due to right facial droop, swallowing without difficulty.   Significant Diagnostic Studies: CBC Lab Results  Component Value Date   WBC 15.3 (H) 12/15/2017   HGB 15.3 12/15/2017   HCT 45.0 12/15/2017   MCV 83.4 12/15/2017   PLT 337 12/15/2017    BMET    Component Value Date/Time   NA 135 12/16/2017 0930   K 4.0 12/16/2017 0930   CL 103 12/16/2017 0930   CO2 25 12/16/2017 0930   GLUCOSE 92 12/16/2017 0930   BUN 11 12/16/2017 0930   CREATININE 1.34 (H) 12/16/2017 0930   CALCIUM 9.0 12/16/2017 0930   GFRNONAA 57 (L) 12/16/2017 0930   GFRAA >60 12/16/2017 0930   CrCl cannot be calculated (Unknown ideal weight.).  COAG Lab Results  Component Value Date   INR 1.07 12/15/2017     Non-Invasive Vascular Imaging:  CTA neck IMPRESSION: 1. Approximately 90% stenosis of the proximal left internal carotid artery due to a large amount of mixed calcified and noncalcified atherosclerotic plaque. 2. Less than 50% stenosis of the proximal right internal carotid artery secondary to atherosclerotic calcification. 3. Normal vertebral arteries.  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   CTA of neck 90% ICA stenosis. He is symptomatic Left CVA with > 80 % ICA stenosis. He will require left CEA.  Emma Maureen Collins 12/17/2017 10:15 AM  I have independently interviewed and examined patient and agree with  PA assessment and plan above. He has residual right facial droop, right arm weakness and dysarthria. I have discussed case with Dr. Xu and reviewed CT angio. Will plan for left CEA with Dr. Chen on Monday. I discussed with patient and his wife that surgery will not improve his symptoms and is to prevent further stroke. He is at risk of MI as well as further stroke from the surgery as well as likely   greater than 5% risk of cranial nerve injury given that it is a high lesion. Continue asa and statin.  Brandon C. Cain, MD [facility] [place] Office: [phone] Pager: [phone]  

## 2017-12-17 NOTE — Consult Note (Addendum)
[facility] [place] [place] CONSULT [place]   MRN : [id number]  Reason for Consult: Carotid stenosis Referring Physician: Dr. Roda ShuttersXu  History of Present Illness: 56 Y/O male with sudden on set of right right UE weakness and slurred speech due to right sided facial droop was brought to the ED on 12/15/2017.  MRI and MRA brain was obtained which showed left  frontal infarct as well as punctate bilateral parietal infarcts.  CTA of the neck revealed left ICA stenosis of 90%.  The patient did receive TPA at 12 am  We are consult for carotid stenosis.  Past medical history includes HTN managed with Lisinopril/HCTZ, hyperlipidemia that was managed with Crestor, but it was discontinued reason not known.  He does take a daily 81 mg aspirin.  He reports no history of CVA or CAD.  He is a chronic tobacco abuser.         Current Facility-Administered Medications  Medication Dose Route Frequency Provider Last Rate Last Dose  . 0.9 %  sodium chloride infusion  50 mL Intravenous Once Aroor, Georgiana SpinnerSushanth R, MD   Stopped at 12/16/17 0230  . 0.9 %  sodium chloride infusion   Intravenous Continuous Marvel PlanXu, Jindong, MD 100 mL/hr at 12/17/17 0700    . acetaminophen (TYLENOL) tablet 650 mg  650 mg Oral Q4H PRN Aroor, Dara LordsSushanth R, MD       Or  . acetaminophen (TYLENOL) solution 650 mg  650 mg Per Tube Q4H PRN Aroor, Dara LordsSushanth R, MD       Or  . acetaminophen (TYLENOL) suppository 650 mg  650 mg Rectal Q4H PRN Aroor, Georgiana SpinnerSushanth R, MD      . atorvastatin (LIPITOR) tablet 80 mg  80 mg Oral q1800 Marvel PlanXu, Jindong, MD      . labetalol (NORMODYNE,TRANDATE) injection 10 mg  10 mg Intravenous Q2H PRN Marvel PlanXu, Jindong, MD      . pantoprazole (PROTONIX) EC tablet 40 mg  40 mg Oral Daily Marvel PlanXu, Jindong, MD   40 mg at 12/17/17 [id number]  . senna-docusate (Senokot-S) tablet 1 tablet  1 tablet Oral QHS PRN Aroor, Dara LordsSushanth R, MD        Pt meds include: Statin :No Betablocker: No ASA: Yes Other anticoagulants/antiplatelets: none  No past  medical history on file.    Social History Social History   Tobacco Use  . Smoking status: Not on file  Substance Use Topics  . Alcohol use: Not on file  . Drug use: Not on file    Family History No family history on file.  Unknown  Allergies  Allergen Reactions  . Shellfish Allergy Shortness Of Breath and Swelling     REVIEW OF SYSTEMS  General: [ ]  Weight loss, [ ]  Fever, [ ]  chills Neurologic: [ ]  Dizziness, [ ]  Blackouts, [ ]  Seizure [ ]  Stroke, [ ]  "Mini stroke", [ ]  Slurred speech, [ ]  Temporary blindness; [ ]  weakness in arms or legs, [ ]  Hoarseness [ ]  Dysphagia Cardiac: [ ]  Chest pain/pressure, [ ]  Shortness of breath at rest [ ]  Shortness of breath with exertion, [ ]  Atrial fibrillation or irregular heartbeat  Vascular: [ ]  Pain in legs with walking, [ ]  Pain in legs at rest, [ ]  Pain in legs at night,  [ ]  Non-healing ulcer, [ ]  Blood clot in vein/DVT,   Pulmonary: [ ]  Home oxygen, [ ]  Productive cough, [ ]  Coughing up blood, [ ]  Asthma,  [ ]  Wheezing [ ]  COPD Musculoskeletal:  [ ]   Arthritis, [ ]  Low back pain, [ ]  Joint pain Hematologic: [ ]  Easy Bruising, [ ]  Anemia; [ ]  Hepatitis Gastrointestinal: [ ]  Blood in stool, [ ]  Gastroesophageal Reflux/heartburn, Urinary: [ ]  chronic Kidney disease, [ ]  on HD - [ ]  MWF or [ ]  TTHS, [ ]  Burning with urination, [ ]  Difficulty urinating Skin: [ ]  Rashes, [ ]  Wounds Psychological: [ ]  Anxiety, [ ]  Depression  Physical Examination Vitals:   12/17/17 0600 12/17/17 0700 12/17/17 0800 12/17/17 0900  BP: 108/71 104/70 (!) 91/54 103/68  Pulse: 71 63 (!) 57 (!) 58  Resp: 17 18 18 18   Temp:   98.2 F (36.8 C)   TempSrc:   Oral   SpO2: 99% 99% 97% 98%  Weight:       There is no height or weight on file to calculate BMI.  General:  WDWN in NAD HENT: WNL Eyes: Pupils equal Pulmonary: normal non-labored breathing , without Rales, rhonchi,  wheezing Cardiac: RRR, without  Murmurs, rubs or gallops;  carotid  bruits Abdomen: soft, NT, no masses Skin: no rashes, ulcers noted;  no Gangrene , no cellulitis; no open wounds;   Vascular Exam/Pulses:Palpable radial, femoral pulses B.  Non palpable pedal pulses, active range of motion B feet intact and sensation intact and equal.  Feet warm to touch and well perfused.   Musculoskeletal: no muscle wasting or atrophy; no edema  Neurologic: A&O X 3; Appropriate Affect ;  SENSATION: normal; MOTOR FUNCTION: 5/5 Symmetric B LE, right UE 0/5 grip, left UE full range of motion grip 5/5 Speech is slurred due to right facial droop, swallowing without difficulty.   Significant Diagnostic Studies: CBC Lab Results  Component Value Date   WBC 15.3 (H) 12/15/2017   HGB 15.3 12/15/2017   HCT 45.0 12/15/2017   MCV 83.4 12/15/2017   PLT 337 12/15/2017    BMET    Component Value Date/Time   NA 135 12/16/2017 0930   K 4.0 12/16/2017 0930   CL 103 12/16/2017 0930   CO2 25 12/16/2017 0930   GLUCOSE 92 12/16/2017 0930   BUN 11 12/16/2017 0930   CREATININE 1.34 (H) 12/16/2017 0930   CALCIUM 9.0 12/16/2017 0930   GFRNONAA 57 (L) 12/16/2017 0930   GFRAA >60 12/16/2017 0930   CrCl cannot be calculated (Unknown ideal weight.).  COAG Lab Results  Component Value Date   INR 1.07 12/15/2017     Non-Invasive Vascular Imaging:  CTA neck IMPRESSION: 1. Approximately 90% stenosis of the proximal left internal carotid artery due to a large amount of mixed calcified and noncalcified atherosclerotic plaque. 2. Less than 50% stenosis of the proximal right internal carotid artery secondary to atherosclerotic calcification. 3. Normal vertebral arteries.  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   CTA of neck 90% ICA stenosis. He is symptomatic Left CVA with > 80 % ICA stenosis. He will require left CEA.  Mosetta Pigeonmma Maureen Collins 12/17/2017 10:15 AM  I have independently interviewed and examined patient and agree with  PA assessment and plan above. He has residual right facial droop, right arm weakness and dysarthria. I have discussed case with Dr. Roda ShuttersXu and reviewed CT angio. Will plan for left CEA with Dr. Imogene Burnhen on Monday. I discussed with patient and his wife that surgery will not improve his symptoms and is to prevent further stroke. He is at risk of MI as well as further stroke from the surgery as well as likely  greater than 5% risk of cranial nerve injury given that it is a high lesion. Continue asa and statin.  Delane Stalling C. Randie Heinz, MD [facility] [place] Office: [phone] Pager: [phone]

## 2017-12-17 NOTE — Plan of Care (Signed)
Will continue to encourage pt to participate in self care.

## 2017-12-17 NOTE — Evaluation (Signed)
Physical Therapy Evaluation Patient Details Name: Jimmy CrumbleDavid B Schlafer MRN: [id number] DOB: 08/12/1961 Today's Date: 12/17/2017   History of Present Illness  56 yo admitted with right weakness with Left frontal infarct and bil parietal with left carotid stenosis s/p tPA. PMHx: HTN  Clinical Impression  Pt presents with hypotonia and decreased coordination of RUE, decreased cognition (memory), difficulty with speech, and impaired mobility secondary to above. Pt tolerates treatment well and BP responded accordingly. Educated pt on signs of stroke. Pt crying at end of session and thankful to be up and out of bed. Pt will benefit from skilled physical therapy acutely to further assess balance and to maintain strength while in hospital setting. Outpatient PT recommendation upon discharge in order to improve upon aforementioned deficits.   BP supine: 108/96, BP seated 134/74, BP standing: 131/77   Follow Up Recommendations Outpatient PT;Supervision for mobility/OOB    Equipment Recommendations  None recommended by PT    Recommendations for Other Services       Precautions / Restrictions Restrictions Weight Bearing Restrictions: No      Mobility  Bed Mobility Overal bed mobility: Needs Assistance Bed Mobility: Supine to Sit     Supine to sit: Min guard;HOB elevated     General bed mobility comments: Min guard and VCs for RUE placement   Transfers Overall transfer level: Needs assistance Equipment used: None Transfers: Sit to/from Stand Sit to Stand: Min guard         General transfer comment: Min guard for safety.   Ambulation/Gait Ambulation/Gait assistance: Min guard Ambulation Distance (Feet): 150 Feet Assistive device: None Gait Pattern/deviations: Step-through pattern;Decreased stride length;Narrow base of support;Antalgic;Decreased weight shift to right Gait velocity: decreased   General Gait Details: Pt with generally stable gait, slow and steady.  Stairs             Wheelchair Mobility    Modified Rankin (Stroke Patients Only) Modified Rankin (Stroke Patients Only) Pre-Morbid Rankin Score: No symptoms Modified Rankin: Moderately severe disability     Balance Overall balance assessment: Needs assistance Sitting-balance support: Feet supported;No upper extremity supported Sitting balance-Leahy Scale: Good Sitting balance - Comments: Pt able to maintain balance while sitting EOB and performing MMT of UE and LEs without LOB.      Standing balance-Leahy Scale: Good Standing balance comment: Pt able to stand without external support and demonstrates weight shifting without LOB.                              Pertinent Vitals/Pain Pain Assessment: No/denies pain    Home Living Family/patient expects to be discharged to:: Private residence Living Arrangements: Spouse/significant other;Children Available Help at Discharge: Family;Available 24 hours/day Type of Home: House Home Access: Level entry     Home Layout: Two level;Able to live on main level with bedroom/bathroom Home Equipment: Grab bars - tub/shower;Cane - single point;Walker - 2 wheels      Prior Function Level of Independence: Independent         Comments: Pt works as a Financial tradermaintenance guy. Wife is home. Son works in [employer] representative[employer].      Hand Dominance   Dominant Hand: Right    Extremity/Trunk Assessment   Upper Extremity Assessment Upper Extremity Assessment: Defer to OT evaluation;RUE deficits/detail RUE Deficits / Details: 3/5 strength grossly, inattention to RUE    Lower Extremity Assessment Lower Extremity Assessment: Overall WFL for tasks assessed    Cervical / Trunk Assessment Cervical /  Trunk Assessment: Normal  Communication   Communication: Expressive difficulties  Cognition Arousal/Alertness: Awake/alert Behavior During Therapy: WFL for tasks assessed/performed Overall Cognitive Status: Impaired/Different from baseline Area of Impairment:  Memory                     Memory: Decreased short-term memory         General Comments: Pt having difficulty with speech, slurring words and having a hard time with pronunciation and recall. Pt is unable to report birthday, however with verbal cueing does arrive at the correct answer. Pt crying towards end of session and is thankful for PT help. Pt not attending to right side.        General Comments General comments (skin integrity, edema, etc.): BP supine: 108/96, BP seated 134/74, BP standing: 131/77.     Exercises     Assessment/Plan    PT Assessment Patient needs continued PT services  PT Problem List Impaired tone;Decreased mobility;Decreased cognition       PT Treatment Interventions DME instruction;Functional mobility training;Balance training;Patient/family education;Gait training;Therapeutic activities;Neuromuscular re-education;Stair training;Therapeutic exercise;Cognitive remediation    PT Goals (Current goals can be found in the Care Plan section)  Acute Rehab PT Goals Patient Stated Goal: to return home PT Goal Formulation: With patient Time For Goal Achievement: 12/31/17 Potential to Achieve Goals: Good Additional Goals Additional Goal #1: Pt will demonstrate knowledge of signs of stroke.    Frequency Min 3X/week   Barriers to discharge        Co-evaluation               AM-PAC PT "6 Clicks" Daily Activity  Outcome Measure Difficulty turning over in bed (including adjusting bedclothes, sheets and blankets)?: None Difficulty moving from lying on back to sitting on the side of the bed? : A Little Difficulty sitting down on and standing up from a chair with arms (e.g., wheelchair, bedside commode, etc,.)?: A Little Help needed moving to and from a bed to chair (including a wheelchair)?: A Little Help needed walking in hospital room?: A Little Help needed climbing 3-5 steps with a railing? : A Little 6 Click Score: 19    End of Session  Equipment Utilized During Treatment: Gait belt Activity Tolerance: Patient tolerated treatment well Patient left: in chair;with call bell/phone within reach;with chair alarm set Nurse Communication: Mobility status PT Visit Diagnosis: Other symptoms and signs involving the nervous system (R29.898);Hemiplegia and hemiparesis Hemiplegia - Right/Left: Right Hemiplegia - dominant/non-dominant: Dominant Hemiplegia - caused by: Cerebral infarction    Time: 1050-1120 PT Time Calculation (min) (ACUTE ONLY): 30 min   Charges:   PT Evaluation $PT Eval Moderate Complexity: 1 Mod PT Treatments $Gait Training: 8-22 mins   PT G Codes:        Reina Fuserin Marquia Costello, SPT  Reina Fuserin [place] Freyre 12/17/2017, 11:54 AM

## 2017-12-17 NOTE — Progress Notes (Signed)
STROKE TEAM PROGRESS NOTE   SUBJECTIVE (INTERVAL HISTORY) His daughter is at the bedside. Neuro stable, still has RUE mild weakness and right facial droop with severe dysarthria. CTA neck showed left ICA high grade stenosis. BP still on the low side, on IVF. Orthostatic vital negative for hypotension. Walking with PT/OT in the hallway.   OBJECTIVE Temp:  [98.2 F (36.8 C)-98.6 F (37 C)] 98.2 F (36.8 C) (12/20 0800) Pulse Rate:  [51-71] 58 (12/20 0900) Cardiac Rhythm: Normal sinus rhythm (12/20 0800) Resp:  [16-24] 18 (12/20 0900) BP: (89-117)/(54-73) 103/68 (12/20 0900) SpO2:  [96 %-100 %] 98 % (12/20 0900)  Recent Labs  Lab 12/15/17 2339  GLUCAP 121*   Recent Labs  Lab 12/15/17 2335 12/15/17 2351 12/16/17 0930  NA 133* 136 135  K 3.6 3.6 4.0  CL 99* 96* 103  CO2 25  --  25  GLUCOSE 115* 112* 92  BUN 10 10 11   CREATININE 1.75* 1.60* 1.34*  CALCIUM 9.6  --  9.0   Recent Labs  Lab 12/15/17 2335  AST 24  ALT 14*  ALKPHOS 65  BILITOT 0.9  PROT 6.9  ALBUMIN 4.0   Recent Labs  Lab 12/15/17 2335 12/15/17 2351  WBC 15.3*  --   NEUTROABS 10.5*  --   HGB 14.9 15.3  HCT 41.7 45.0  MCV 83.4  --   PLT 337  --    No results for input(s): CKTOTAL, CKMB, CKMBINDEX, TROPONINI in the last 168 hours. Recent Labs    12/15/17 2335  LABPROT 13.8  INR 1.07   No results for input(s): COLORURINE, LABSPEC, PHURINE, GLUCOSEU, HGBUR, BILIRUBINUR, KETONESUR, PROTEINUR, UROBILINOGEN, NITRITE, LEUKOCYTESUR in the last 72 hours.  Invalid input(s): APPERANCEUR     Component Value Date/Time   CHOL 181 12/16/2017 0312   TRIG 343 (H) 12/16/2017 0312   HDL 24 (L) 12/16/2017 0312   CHOLHDL 7.5 12/16/2017 0312   VLDL 69 (H) 12/16/2017 0312   LDLCALC 88 12/16/2017 0312   Lab Results  Component Value Date   HGBA1C 5.6 12/16/2017      Component Value Date/Time   LABOPIA NONE DETECTED 12/16/2017 1130   COCAINSCRNUR NONE DETECTED 12/16/2017 1130   LABBENZ NONE DETECTED  12/16/2017 1130   AMPHETMU NONE DETECTED 12/16/2017 1130   THCU NONE DETECTED 12/16/2017 1130   LABBARB NONE DETECTED 12/16/2017 1130    No results for input(s): ETH in the last 168 hours.  I have personally reviewed the radiological images below and agree with the radiology interpretations.  Mri Mra head and Neck Wo Contrast 12/16/2017 IMPRESSION: 1. Multiple punctate foci of acute ischemia within the left frontal lobe and bilateral superior parietal lobes. The largest area of ischemia is along the left precentral gyrus. No hemorrhage or mass effect. 2. Loss of the normal flow related enhancement is of the proximal left internal carotid artery, likely indicating occlusion or critical stenosis. MR angiography may overestimate the degree of stenosis. CT angiography or conventional angiography may provide more accurate assessment. 3. 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   Ct Head Code Stroke Wo Contrast 12/16/2017 IMPRESSION: 1. No acute hemorrhage or mass effect. 2. 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 3. ASPECTS is 10.   TTE - Left ventricle: The cavity size was normal. Systolic function was   normal. The estimated ejection fraction was in the range of 60%   to 65%. Wall motion was normal; there were no regional wall   motion abnormalities. Left ventricular diastolic  function   parameters were normal. - Atrial septum: No defect or patent foramen ovale was identified. Impressions: - No cardiac source of emboli was indentified.  Ct Head Wo Contrast 12/17/2017 IMPRESSION: No acute hemorrhage. Unchanged appearance of the brain compared to 12/15/2017 head CT.  Ct Angio Neck W Or Wo Contrast 12/17/2017 IMPRESSION: 1. Approximately 90% stenosis of the proximal left internal carotid artery due to a large amount of mixed calcified and noncalcified atherosclerotic plaque. 2. Less than 50% stenosis of the proximal right internal carotid artery secondary to atherosclerotic  calcification. 3. Normal vertebral arteries.     PHYSICAL EXAM  Temp:  [98.2 F (36.8 C)-98.6 F (37 C)] 98.2 F (36.8 C) (12/20 0800) Pulse Rate:  [51-71] 58 (12/20 0900) Resp:  [16-24] 18 (12/20 0900) BP: (89-117)/(54-73) 103/68 (12/20 0900) SpO2:  [96 %-100 %] 98 % (12/20 0900)  General - Well nourished, well developed, in no apparent distress.  Ophthalmologic - fundi not visualized due to noncooperation.  Cardiovascular - Regular rate and rhythm with no murmur.  Mental Status -  Level of arousal and orientation to place, and person were intact, but not to time. Language including expression, naming, repetition, comprehension was assessed and found intact, moderate to severe dysarthria  Cranial Nerves II - XII - II - Visual field intact OU. III, IV, VI - Extraocular movements intact. V - Facial sensation intact bilaterally. VII - right facial droop. VIII - Hearing & vestibular intact bilaterally. X - Palate elevates symmetrically, moderate to severe dysarthria. XI - Chin turning & shoulder shrug intact bilaterally. XII - Tongue protrusion intact.  Motor Strength - The patient's strength was normal in all extremities except RUE 4/5 deltoid, bicep, tricep, but 3-/5 finger grip and pronator drift was present on the right.  Bulk was normal and fasciculations were absent.   Motor Tone - Muscle tone was assessed at the neck and appendages and was normal.  Reflexes - The patient's reflexes were symmetrical in all extremities and he had no pathological reflexes.  Sensory - Light touch, temperature/pinprick were assessed and were symmetrical.    Coordination - The patient had normal movements in the hands with no ataxia or dysmetria.  Tremor was absent.  Gait and Station - slow pace but normal gait.   ASSESSMENT/PLAN Mr. Jimmy Morales is a 56 y.o. male with history of HTN and smoker admitted for right sided weakness and slurry speech. TPA given.    Stroke:  left MCA,  MCA/ACA, right ACA, MCA/ACA scatted punctate infarcts, most likely A-A embolic due to left ICA high grade stenosis with azygous ACAs   Resultant right facial droop, dysarthria and RUE weakness  MRI  left MCA, MCA/ACA, right ACA, MCA/ACA scatted punctate infarcts  MRA  Head negative, azygous ACAs  MRA neck - left ICA proximal high grade stenosis  CTA neck left ICA 90% stenosis  2D Echo  EF 60-65%  LDL 88  HgbA1c 5.6  SCDs for VTE prophylaxis  Diet regular Room service appropriate? Yes; Fluid consistency: Thin   No antithrombotic prior to admission, now on ASA 325.  Patient counseled to be compliant with his antithrombotic medications  Ongoing aggressive stroke risk factor management  Therapy recommendations:  pending  Disposition:  Pending  Left ICA stenosis  Confirmed on MRA neck  CTA neck showed left ICA 90% stenosis  VVS consulted for left CEA  Avoid hypotension  Hypotension BP at low side Permissive hypertension (OK if <180/105) for 24-48 hours post stroke and  then gradually normalized within 5-7 days.  Long term BP goal 130-150 due to left ICA stenosis before revascularization  On IVF @ 100  Orthostatic vital - negative for orthostatic hypotension  Hyperlipidemia  Home meds:  none   LDL 88, goal < 70  Now on lipitor 80mg   Continue statin at discharge  Tobacco abuse  Current smoker - 1PPD  Smoking cessation counseling provided  Pt is willing to quit  Other Stroke Risk Factors  Advanced age  Hx of stroke in imaging - left MCA parietal infarct  Other Active Problems  Elevated Cre - 1.60->1.34  Hospital day # 1  This patient is critically ill due to stroke, left carotid stenosis, s/p tPA, smoker and at significant risk of neurological worsening, death form recurrent large infarct, hemorrhagic conversion, seizure, cerebral edema. This patient's care requires constant monitoring of vital signs, hemodynamics, respiratory and cardiac  monitoring, review of multiple databases, neurological assessment, discussion with family, other specialists and medical decision making of high complexity. I had long discussion with pt and son at bedside, updated pt current condition, treatment plan and potential prognosis. They expressed understanding and appreciation. I spent 35 minutes of neurocritical care time in the care of this patient.   Jimmy PlanJindong Lamonda Noxon, MD PhD Stroke Neurology 12/17/2017 12:21 PM    To contact Stroke Continuity provider, please refer to [email or web address]. After hours, contact General Neurology

## 2017-12-17 NOTE — Evaluation (Signed)
Occupational Therapy Evaluation Patient Details Name: Jimmy CrumbleDavid B Morales MRN: [id number] DOB: 12/13/1961 Today's Date: 12/17/2017    History of Present Illness 56 yo admitted with right weakness with Left frontal infarct and bil parietal with left carotid stenosis s/p tPA. PMHx: HTN   Clinical Impression   Pt reports he was independent with ADL PTA. Currently pt overall supervision for functional mobility and mod assist for ADL. Pt presenting with RUE decreased strength, limited AROM, and decreased sensation. Pt planning to d/c home with 24/7 supervision from family. Recommending Neuro Outpatient OT for follow up to maximize independence and safety with ADL. Pt would benefit from continued skilled OT to address established goals.    Follow Up Recommendations  Outpatient OT(Neuro Outpatient OT)    Equipment Recommendations  None recommended by OT    Recommendations for Other Services       Precautions / Restrictions Precautions Precautions: None Restrictions Weight Bearing Restrictions: No      Mobility Bed Mobility Overal bed mobility: Needs Assistance Bed Mobility: Supine to Sit     Supine to sit: Min guard;HOB elevated     General bed mobility comments: Pt OOB in chair upon arrival  Transfers Overall transfer level: Needs assistance Equipment used: None Transfers: Sit to/from Stand Sit to Stand: Supervision         General transfer comment: for safety, no unsteadiness or LOB noted    Balance Overall balance assessment: Needs assistance Sitting-balance support: Feet supported;No upper extremity supported Sitting balance-Leahy Scale: Normal    Standing balance support: No upper extremity supported;During functional activity Standing balance-Leahy Scale: Good                            ADL either performed or assessed with clinical judgement   ADL Overall ADL's : Needs assistance/impaired Eating/Feeding: Minimal assistance;Sitting   Grooming:  Moderate assistance;Standing   Upper Body Bathing: Moderate assistance;Sitting   Lower Body Bathing: Moderate assistance;Sit to/from stand   Upper Body Dressing : Moderate assistance;Sitting   Lower Body Dressing: Moderate assistance;Sit to/from stand   Toilet Transfer: Supervision/safety;Ambulation;Regular Toilet           Functional mobility during ADLs: Supervision/safety General ADL Comments: Encouraged functional use of RUE and touch for sensation deficits. Educated pt on self ROM techniques and provided pt with squeeze ball.      Vision Baseline Vision/History: Wears glasses Wears Glasses: Reading only Patient Visual Report: No change from baseline Vision Assessment?: No apparent visual deficits     Perception     Praxis      Pertinent Vitals/Pain Pain Assessment: No/denies pain     Hand Dominance Right   Extremity/Trunk Assessment Upper Extremity Assessment Upper Extremity Assessment: RUE deficits/detail RUE Deficits / Details: Poor grip strength, elbow 3/5, shoulder 4/5. Decreased fine/gross motor. Decreased sensation--pt reporting tingling throughout, distal>proximal. PROM WFL. RUE Sensation: decreased light touch;decreased proprioception RUE Coordination: decreased fine motor;decreased gross motor   Lower Extremity Assessment Lower Extremity Assessment: Defer to PT evaluation   Cervical / Trunk Assessment Cervical / Trunk Assessment: Normal   Communication Communication Communication: Expressive difficulties   Cognition Arousal/Alertness: Awake/alert Behavior During Therapy: WFL for tasks assessed/performed Overall Cognitive Status: Difficult to assess Area of Impairment: Memory                     Memory: Decreased short-term memory            General Comments  Exercises     Shoulder Instructions      Home Living Family/patient expects to be discharged to:: Private residence Living Arrangements: Spouse/significant  other;Children Available Help at Discharge: Family;Available 24 hours/day Type of Home: House Home Access: Level entry     Home Layout: Two level;Able to live on main level with bedroom/bathroom     Bathroom Shower/Tub: Tub/shower unit;Curtain   Bathroom Toilet: Standard     Home Equipment: Grab bars - tub/shower;Cane - single point;Walker - 2 wheels      Lives With: Spouse;Son    Prior Functioning/Environment Level of Independence: Independent        Comments: Pt works as a Financial tradermaintenance guy. Wife is home. Son works in [employer].         OT Problem List: Decreased strength;Decreased range of motion;Impaired balance (sitting and/or standing);Decreased coordination;Impaired sensation;Impaired tone;Impaired UE functional use      OT Treatment/Interventions: Self-care/ADL training;Therapeutic exercise;Neuromuscular education;Energy conservation;DME and/or AE instruction;Therapeutic activities;Patient/family education;Balance training    OT Goals(Current goals can be found in the care plan section) Acute Rehab OT Goals Patient Stated Goal: to get better OT Goal Formulation: With patient/family Time For Goal Achievement: 12/31/17 Potential to Achieve Goals: Good ADL Goals Pt Will Perform Eating: with set-up;with adaptive utensils;sitting Pt Will Perform Grooming: with set-up;with supervision;with adaptive equipment;standing Pt Will Perform Upper Body Bathing: with min assist;standing Pt Will Perform Lower Body Bathing: with min assist;sit to/from stand Pt/caregiver will Perform Home Exercise Program: Right Upper extremity;Independently;With written HEP provided;Increased ROM;Increased strength  OT Frequency: Min 3X/week   Barriers to D/C:            Co-evaluation              AM-PAC PT "6 Clicks" Daily Activity     Outcome Measure Help from another person eating meals?: A Little Help from another person taking care of personal grooming?: A Lot Help from  another person toileting, which includes using toliet, bedpan, or urinal?: A Little Help from another person bathing (including washing, rinsing, drying)?: A Lot Help from another person to put on and taking off regular upper body clothing?: A Lot Help from another person to put on and taking off regular lower body clothing?: A Lot 6 Click Score: 14   End of Session    Activity Tolerance: Patient tolerated treatment well Patient left: in chair;with call bell/phone within reach;with family/visitor present  OT Visit Diagnosis: Unsteadiness on feet (R26.81);Muscle weakness (generalized) (M62.81);Hemiplegia and hemiparesis Hemiplegia - Right/Left: Right Hemiplegia - dominant/non-dominant: Dominant                Time: [id number] OT Time Calculation (min): 18 min Charges:  OT General Charges $OT Visit: 1 Visit OT Evaluation $OT Eval Moderate Complexity: 1 Mod G-Codes:     Jean Skow A. Brett Albinooffey, M.S., OTR/L Pager: [phone]  Gaye AlkenBailey A Lamiracle Chaidez 12/17/2017, 2:48 PM

## 2017-12-17 NOTE — Progress Notes (Signed)
  Speech Language Pathology Treatment: Cognitive-Linquistic  Patient Details Name: Jimmy CrumbleDavid B Morales MRN: [id number] DOB: 01/26/1961 Today's Date: 12/17/2017 Time: 1543-1600 SLP Time Calculation (min) (ACUTE ONLY): 17 min  Assessment / Plan / Recommendation Clinical Impression  Pt was seen for skilled ST targeting communication goals.  Pt just transferred to unit, seated upright in bed, and agreeable to participate in ST.  Pt initially needed min assist verbal cues for word finding during a structured generative naming task which SLP suspects to be related to environmental distraction because when distractions were eliminated therapist was able to fade cues to supervision.  Pt was intelligible at the phrase/sentence level with supervision verbal cues to slow rate and overarticulate.  SLP provided skilled education regarding intelligibility compensatory strategies.  All questions were answered to pt's satisfaction at this time and pt was left sitting up at edge of bed with call bell within reach and bed alarm set.     HPI HPI: 56 y.o. male AA RH male with PMH of hypertension, tobacco use presents with sudden onset right-sided weakness and slurred speech. Received TPA. CT angiogram was not done due to elevated creatinine. However stat MRI and MRA brain was obtained which showed left  frontal infarct as well as punctate bilateral parietal infarcts. Also demonstrates older left frontal and parietal lobe infarcts.       SLP Plan  Continue with current plan of care       Recommendations                   Plan: Continue with current plan of care       GO                PageMelanee Morales, Jimmy Morales 12/17/2017, 4:06 PM

## 2017-12-17 NOTE — Progress Notes (Signed)
PT Cancellation Note  Patient Details Name: Jimmy Morales MRN: [id number] DOB: 06/09/1961   Cancelled Treatment:    Reason Eval/Treat Not Completed: Medical issues which prohibited therapy(pt remains on strict bedrest)   Jimmy Morales 12/17/2017, 7:22 AM  Jimmy Morales, PT [phone]

## 2017-12-17 NOTE — Progress Notes (Signed)
Pt arrived to 3W20 via wheeelchair.  Pt alert and oriented, no complaints of pain.  VSS.  Will continue to monitor.  Sondra ComeSilva, Megyn Leng M, RN

## 2017-12-18 ENCOUNTER — Telehealth: Payer: Self-pay | Admitting: *Deleted

## 2017-12-18 ENCOUNTER — Encounter (HOSPITAL_COMMUNITY): Payer: Self-pay | Admitting: *Deleted

## 2017-12-18 ENCOUNTER — Other Ambulatory Visit: Payer: Self-pay

## 2017-12-18 ENCOUNTER — Other Ambulatory Visit: Payer: Self-pay | Admitting: *Deleted

## 2017-12-18 MED ORDER — ASPIRIN 325 MG PO TBEC
325.0000 mg | DELAYED_RELEASE_TABLET | Freq: Every day | ORAL | 5 refills | Status: AC
Start: 2017-12-19 — End: ?

## 2017-12-18 MED ORDER — CEFAZOLIN SODIUM-DEXTROSE 1-4 GM/50ML-% IV SOLN: 1.0000 g | INTRAVENOUS | Status: DC

## 2017-12-18 MED ORDER — ATORVASTATIN CALCIUM 80 MG PO TABS: 80.0000 mg | ORAL_TABLET | Freq: Every day | ORAL | 5 refills | Status: AC

## 2017-12-18 NOTE — Progress Notes (Unsigned)
Call[facility] and [facility] that this patient will be going home today and will need a call from their department for surgery on 12/21/17.

## 2017-12-18 NOTE — Telephone Encounter (Signed)
Spoke with patient's wife instructed to be at [facility] at 5:30 am on 12/21/2017. No food or drink past MN night prior, and follow the detailed instructions he receives from the pre admission testing department. Verbalized understanding.

## 2017-12-18 NOTE — Discharge Instructions (Signed)
SURGERY INSTRUCTIONS FOR Monday 12/21/2017  NO SMOKING FOR 24 HOURS PRIOR TO SURGERY  Take Aspirin 325 mg on the morning of surgery with a sip of water. Do not eat or drink anything except for taking the Aspirin after midnight the morning of 12/21/2017. This includes gum and candy.   [facility] by 530 AM. You will check in at admitting.   If you have any questions please call [phone]            Please quit smoking Please come back on Monday 12/21/17 for left carotid surgery. Keep nothing by mouth after midnight at Sunday night.  Will refer you to outpatient therapy for right sided weakness, you will be called for those appointment Keep mainly bed rest for the next several days until you have surgery done. Drink plenty of fluid to avoid low BP Check your BP at home and BP can not be too low. Higher number should not lower than 105. I have stopped your BP meds, your BP was on the low side in the hospital.  We will call you for appointment to follow up with us in neurology clinic in 6 weeks.  Continue the medication as prescribed, no missing dose.  Healthy diet, follow up with your primary care doctors in 2 weeks after discharge.  Follow up with vascular surgery as scheduled.    Carotid Artery Disease The carotid arteries are the two main arteries on either side of the neck that supply blood to the brain. Carotid artery disease, also called carotid artery stenosis, is the narrowing or blockage of one or both carotid arteries. Carotid artery disease increases your risk for a stroke or a transient ischemic attack (TIA). A TIA is an episode in which a waxy, fatty substance that accumulates within the artery (plaque) blocks blood flow to the brain. A TIA is considered a "warning stroke." What are the causes?  Buildup of plaque inside the carotid arteries (atherosclerosis) (common).  A weakened outpouching in an artery  (aneurysm).  Inflammation of the carotid artery (arteritis).  A fibrous growth within the carotid artery (fibromuscular dysplasia).  Tissue death within the carotid artery due to radiation treatment (post-radiation necrosis).  Decreased blood flow due to spasms of the carotid artery (vasospasm).  Separation of the walls of the carotid artery (carotid dissection). What increases the risk?  High cholesterol (dyslipidemia).  High blood pressure (hypertension).  Smoking.  Obesity.  Diabetes.  Family history of cardiovascular disease.  Inactivity or lack of regular exercise.  Being male. Men have an increased risk of developing atherosclerosis earlier in life than women. What are the signs or symptoms? Carotid artery disease does not cause symptoms. How is this diagnosed? Diagnosis of carotid artery disease may include:  A physical exam. Your health care provider may hear an abnormal sound (bruit) when listening to the carotid arteries.  Specific tests that look at the blood flow in the carotid arteries. These tests include: ? Carotid artery ultrasonography. ? Carotid or cerebral angiography. ? Computerized tomographic angiography (CTA). ? Magnetic resonance angiography (MRA).  How is this treated? Treatment of carotid artery disease can include a combination of treatments. Treatment options include:  Surgery. You may have: ? A carotid endarterectomy. This is a surgery to remove the blockages in the carotid arteries. ? A carotid angioplasty with stenting. This is a nonsurgical interventional procedure. A wire mesh (stent) is used to widen the blocked carotid arteries.  Medicines to control blood pressure, cholesterol, and reduce blood clotting (antiplatelet therapy).  Adjusting your diet.  Lifestyle changes such as: ? Quitting smoking. ? Exercising as tolerated or as directed by your health care provider. ? Controlling and maintaining a good blood  pressure. ? Keeping cholesterol levels under control.  Follow these instructions at home:  Take medicines only as directed by your health care provider. Make sure you understand all your medicine instructions. Do not stop your medicines without talking to your health care provider.  Follow your health care provider's diet instructions. It is important to eat a healthy diet that is low in saturated fats and includes plenty of fresh fruits, vegetables, and lean meats. High-fat, high-sodium foods as well as foods that are fried, overly processed, or have poor nutritional value should be avoided.  Maintain a healthy weight.  Stay physically active. It is recommended that you get at least 30 minutes of activity every day.  Do not use any tobacco products including cigarettes, chewing tobacco, or electronic cigarettes. If you need help quitting, ask your health care provider.  Limit alcohol use to: ? No more than 2 drinks per day for men. ? No more than 1 drink per day for nonpregnant women.  Do not use illegal drugs.  Keep all follow-up visits as directed by your health care provider. Get help right away if: You develop TIA or stroke symptoms. These include:  Sudden weakness or numbness on one side of the body, such as in the face, arm, or leg.  Sudden confusion.  Trouble speaking (aphasia) or understanding.  Sudden trouble seeing out of one or both eyes.  Sudden trouble walking.  Dizziness or feeling like you might faint.  Loss of balance or coordination.  Sudden severe headache with no known cause.  Sudden trouble swallowing (dysphagia).  If you have any of these symptoms, call your local emergency services (911 in [place] Do not drive yourself to the clinic or hospital. This is a medical emergency. This information is not intended to replace advice given to you by your health care provider. Make sure you discuss any questions you have with your health care provider. Document  Released: 03/08/2012 Document Revised: 05/22/2016 Document Reviewed: 06/15/2013 Elsevier Interactive Patient Education  2017 [employer] Inc.   Carotid Endarterectomy A carotid endarterectomy is a surgery to remove a blockage in the carotid arteries. The carotid arteries are the large blood vessels on both sides of the neck that supply blood to the brain. Carotid artery disease, also called carotid artery stenosis, is the narrowing or blockage of one or both carotid arteries. Carotid artery disease is usually caused by atherosclerosis, which is a buildup of fat and plaques in the arteries. Some buildup of plaques normally occurs with aging. The plaques may partially or totally block blood flow or cause a clot to form in the carotid arteries. This may cause a stroke. Tell a health care provider about:  Any allergies you have.  All medicines you are taking, including vitamins, herbs, eye drops, creams, and over-the-counter medicines.  Any problems you or family members have had with anesthetic medicines.  Any blood disorders you have.  Any surgeries you have had.  Any medical conditions you have, including diabetes, kidney problems, and infections.  Whether you are pregnant or may be pregnant. What are the risks? Generally, this is a safe procedure. However, problems may occur, including:  Infection.  Bleeding.  Blood clots.  Allergic reactions to medicines.  Damage to nerves  near the carotid arteries. This can cause a hoarse voice or weakness of muscles your face.  Stroke.  Seizures.  Heart attack (myocardial infarction).  Narrowing of the opened blood vessel (re-stenosis) . This may require another surgery.  What happens before the procedure? Medicines  Ask your health care provider about: ? Changing or stopping your regular medicines. This is especially important if you are taking diabetes medicines or blood thinners. ? Taking medicines such as aspirin and ibuprofen.  These medicines can thin your blood. Do not take these medicines before your procedure if your health care provider instructs you not to.  You may be given antibiotic medicine to help prevent infection. Staying hydrated Follow instructions from your health care provider about hydration, which may include:  Up to 2 hours before the procedure - you may continue to drink clear liquids, such as water, clear fruit juice, black coffee, and plain tea.  Eating and drinking restrictions Follow instructions from your health care provider about eating and drinking, which may include:  8 hours before the procedure - stop eating heavy meals or foods such as meat, fried foods, or fatty foods.  6 hours before the procedure - stop eating light meals or foods, such as toast or cereal.  6 hours before the procedure - stop drinking milk or drinks that contain milk.  2 hours before the procedure - stop drinking clear liquids.  General instructions  Do not use any products that contain nicotine or tobacco, such as cigarettes and e-cigarettes. These can delay healing after surgery. If you need help quitting, ask your health care provider.  You may need to have blood tests, a test to check heart rhythm (electrocardiogram), or a test to check blood flow (angiogram).  Plan to have someone take you home from the hospital or clinic.  Ask your health care provider how your surgical site will be marked or identified.  You may be asked to shower with a germ-killing soap. What happens during the procedure?  To lower your risk of infection: ? Your health care team will wash or sanitize their hands. ? Your skin will be washed with soap. ? Hair may be removed from the surgical area.  An IV tube will be inserted into one of your veins.  You will be given one or more of the following: ? A medicine to help you relax (sedative). ? A medicine to make you fall asleep (general anesthetic).  The surgeon will make a  small incision in your neck to expose the carotid artery.  A tube may be inserted into the carotid artery above and below the blockage. This tube will temporarily allow blood to flow around the blockage during the surgery.  An incision will be made in the carotid artery at the location of the blockage, then the blockage will be removed. In some cases, a section of the carotid artery may be removed and a graft patch may be used to repair the artery.  The carotid artery will be closed with stitches (sutures).  If a tube was inserted into the artery to allow blood flow around the blockage during surgery, the tube will be removed. With the tube removed, blood flow to the brain will be restored through the carotid artery.  The incision in the neck will be closed with sutures.  A bandage (dressing) will be placed over your incision. The procedure may vary among health care providers and hospitals. What happens after the procedure?  Your blood pressure, heart  rate, breathing rate, and blood oxygen level will be monitored until the medicines you were given have worn off.  You may have some pain or an ache in your neck for a couple weeks. This is normal.  Do not drive for 24 hours if you were given a sedative. Summary  A carotid endarterectomy is a surgery to remove a blockage in the carotid arteries.  The carotid arteries are the large blood vessels on both sides of the neck that supply blood to the brain.  Before the procedure, ask your health care provider about changing or stopping your regular medicines.  Follow instructions from your health care provider about eating and drinking before the procedure.  After the procedure, do not drive for 24 hours if you were given a sedative. This information is not intended to replace advice given to you by your health care provider. Make sure you discuss any questions you have with your health care provider. Document Released: 08/17/2013 Document  Revised: 11/03/2016 Document Reviewed: 11/03/2016 Elsevier Interactive Patient Education  2017 [employer].   Ischemic Stroke An ischemic stroke is the sudden death of brain tissue. Blood carries oxygen to all areas of the body. This type of stroke happens when your blood does not flow to your brain like normal. Your brain cannot get the oxygen it needs. This is an emergency. It must be treated right away. Symptoms of a stroke usually happen all of a sudden. You may notice them when you wake up. They can include:  Weakness or loss of feeling in your face, arm, or leg. This often happens on one side of the body.  Trouble walking.  Trouble moving your arms or legs.  Loss of balance or coordination.  Feeling confused.  Trouble talking or understanding what people are saying.  Slurred speech.  Trouble seeing.  Seeing two of one object (double vision).  Feeling dizzy.  Feeling sick to your stomach (nauseous) and throwing up (vomiting).  A very bad headache for no reason.  Get help as soon as any of these problems start. This is important. Some treatments work better if they are given right away. These include:  Aspirin.  Medicines to control blood pressure.  A shot (injection) of medicine to break up the blood clot.  Treatments given in the blood vessel (artery) to take out the clot or break it up.  Other treatments may include:  Oxygen.  Fluids given through an IV tube.  Medicines to thin out your blood.  Procedures to help your blood flow better.  What increases the risk? Certain things may make you more likely to have a stroke. Some of these are things that you can change, such as:  Being very overweight (obesity).  Smoking.  Taking birth control pills.  Not being active.  Drinking too much alcohol.  Using drugs.  Other risk factors include:  High blood pressure.  High cholesterol.  Diabetes.  Heart disease.  Being [place] American, Native  [address], Hispanic, or [place] Native.  Being over age 63.  Family history of stroke.  Having had blood clots, stroke, or warning stroke (transient ischemic attack, TIA) in the past.  Sickle cell disease.  Being a woman with a history of high blood pressure in pregnancy (preeclampsia).  Migraine headache.  Sleep apnea.  Having an irregular heartbeat (atrial fibrillation).  Long-term (chronic) diseases that cause soreness and swelling (inflammation).  Disorders that affect how your blood clots.  Follow these instructions at home: Medicines  Take  over-the-counter and prescription medicines only as told by your doctor.  If you were told to take aspirin or another medicine to thin your blood, take it exactly as told by your doctor. ? Taking too much of the medicine can cause bleeding. ? If you do not take enough, it may not work as well.  Know the side effects of your medicines. If you are taking a blood thinner, make sure you: ? Hold pressure over any cuts for longer than usual. ? Tell your dentist and other doctors that you take this medicine. ? Avoid activities that may cause damage or injury to your body. Eating and drinking  Follow instructions from your doctor about what you cannot eat or drink.  Eat healthy foods.  If you have trouble with swallowing, do these things to avoid choking: ? Take small bites when eating. ? Eat foods that are soft or pureed. Safety  Follow instructions from your health care team about physical activity.  Use a walker or cane as told by your doctor.  Keep your home safe so you do not fall. This may include: ? Having experts look at your home to make sure it is safe. ? Putting grab bars in the bedroom and bathroom. ? Using raised toilets. ? Putting a seat in the shower. General instructions  Do not use any tobacco products. ? Examples of these are cigarettes, chewing tobacco, and e-cigarettes. ? If you need help quitting, ask your  doctor.  Limit how much alcohol you drink. This means no more than 1 drink a day for nonpregnant women and 2 drinks a day for men. One drink equals 12 oz of beer, 5 oz of wine, or 1 oz of hard liquor.  If you need help to stop using drugs or alcohol, ask your doctor to refer you to a program or specialist.  Stay active. Exercise as told by your doctor.  Keep all follow-up visits as told by your doctor. This is important. Get help right away if:  You suddenly: ? Have weakness or loss of feeling in your face, arm, or leg. ? Feel confused. ? Have trouble talking or understanding what people are saying. ? Have trouble seeing. ? Have trouble walking. ? Have trouble moving your arms or legs. ? Feel dizzy. ? Lose your balance or coordination. ? Have a very bad headache and you do not know why.  You pass out (lose consciousness) or almost pass out.  You have jerky movements that you cannot control (seizure). These symptoms may be an emergency. Do not wait to see if the symptoms will go away. Get medical help right away. Call your local emergency services (911 in the [place] Do not drive yourself to the hospital. This information is not intended to replace advice given to you by your health care provider. Make sure you discuss any questions you have with your health care provider. Document Released: 12/04/2011 Document Revised: 05/27/2016 Document Reviewed: 03/12/2016 Elsevier Interactive Patient Education  2018 [employer]   Stroke Prevention Some medical conditions and behaviors are associated with a higher chance of having a stroke. You can help prevent a stroke by making nutrition, lifestyle, and other changes, including managing any medical conditions you may have. What nutrition changes can be made?  Eat healthy foods. You can do this by: ? Choosing foods high in fiber, such as fresh fruits and vegetables and whole grains. ? Eating at least 5 or more servings of fruits and  vegetables a day.  Try to fill half of your plate at each meal with fruits and vegetables. ? Choosing lean protein foods, such as lean cuts of meat, poultry without skin, fish, tofu, beans, and nuts. ? Eating low-fat dairy products. ? Avoiding foods that are high in salt (sodium). This can help lower blood pressure. ? Avoiding foods that have saturated fat, trans fat, and cholesterol. This can help prevent high cholesterol. ? Avoiding processed and premade foods.  Follow your health care provider's specific guidelines for losing weight, controlling high blood pressure (hypertension), lowering high cholesterol, and managing diabetes. These may include: ? Reducing your daily calorie intake. ? Limiting your daily sodium intake to 1,500 milligrams (mg). ? Using only healthy fats for cooking, such as olive oil, canola oil, or sunflower oil. ? Counting your daily carbohydrate intake. What lifestyle changes can be made?  Maintain a healthy weight. Talk to your health care provider about your ideal weight.  Get at least 30 minutes of moderate physical activity at least 5 days a week. Moderate activity includes brisk walking, biking, and swimming.  Do not use any products that contain nicotine or tobacco, such as cigarettes and e-cigarettes. If you need help quitting, ask your health care provider. It may also be helpful to avoid exposure to secondhand smoke.  Limit alcohol intake to no more than 1 drink a day for nonpregnant women and 2 drinks a day for men. One drink equals 12 oz of beer, 5 oz of wine, or 1 oz of hard liquor.  Stop any illegal drug use.  Avoid taking birth control pills. Talk to your health care provider about the risks of taking birth control pills if: ? You are over 58 years old. ? You smoke. ? You get migraines. ? You have ever had a blood clot. What other changes can be made?  Manage your cholesterol levels. ? Eating a healthy diet is important for preventing high  cholesterol. If cholesterol cannot be managed through diet alone, you may also need to take medicines. ? Take any prescribed medicines to control your cholesterol as told by your health care provider.  Manage your diabetes. ? Eating a healthy diet and exercising regularly are important parts of managing your blood sugar. If your blood sugar cannot be managed through diet and exercise, you may need to take medicines. ? Take any prescribed medicines to control your diabetes as told by your health care provider.  Control your hypertension. ? To reduce your risk of stroke, try to keep your blood pressure below 130/80. ? Eating a healthy diet and exercising regularly are an important part of controlling your blood pressure. If your blood pressure cannot be managed through diet and exercise, you may need to take medicines. ? Take any prescribed medicines to control hypertension as told by your health care provider. ? Ask your health care provider if you should monitor your blood pressure at home. ? Have your blood pressure checked every year, even if your blood pressure is normal. Blood pressure increases with age and some medical conditions.  Get evaluated for sleep disorders (sleep apnea). Talk to your health care provider about getting a sleep evaluation if you snore a lot or have excessive sleepiness.  Take over-the-counter and prescription medicines only as told by your health care provider. Aspirin or blood thinners (antiplatelets or anticoagulants) may be recommended to reduce your risk of forming blood clots that can lead to stroke.  Make sure that any other medical conditions you have, such as  atrial fibrillation or atherosclerosis, are managed. What are the warning signs of a stroke? The warning signs of a stroke can be easily remembered as BEFAST.  B is for balance. Signs include: ? Dizziness. ? Loss of balance or coordination. ? Sudden trouble walking.  E is for eyes. Signs  include: ? A sudden change in vision. ? Trouble seeing.  F is for face. Signs include: ? Sudden weakness or numbness of the face. ? The face or eyelid drooping to one side.  A is for arms. Signs include: ? Sudden weakness or numbness of the arm, usually on one side of the body.  S is for speech. Signs include: ? Trouble speaking (aphasia). ? Trouble understanding.  T is for time. ? These symptoms may represent a serious problem that is an emergency. Do not wait to see if the symptoms will go away. Get medical help right away. Call your local emergency services (911 in the [place] Do not drive yourself to the hospital.  Other signs of stroke may include: ? A sudden, severe headache with no known cause. ? Nausea or vomiting. ? Seizure.  Where to find more information: For more information, visit:  American Stroke Association: www.strokeassociation.org  National Stroke Association: www.stroke.org  Summary  You can prevent a stroke by eating healthy, exercising, not smoking, limiting alcohol intake, and managing any medical conditions you may have.  Do not use any products that contain nicotine or tobacco, such as cigarettes and e-cigarettes. If you need help quitting, ask your health care provider. It may also be helpful to avoid exposure to secondhand smoke.  Remember BEFAST for warning signs of stroke. Get help right away if you or a loved one has any of these signs. This information is not intended to replace advice given to you by your health care provider. Make sure you discuss any questions you have with your health care provider. Document Released: 01/22/2005 Document Revised: 01/20/2017 Document Reviewed: 01/20/2017 Elsevier Interactive Patient Education  [employer].

## 2017-12-18 NOTE — Progress Notes (Addendum)
Occupational Therapy Treatment Patient Details Name: Jimmy CrumbleDavid B Saylor MRN: [id number] DOB: 05/14/1961 Today's Date: 12/18/2017    History of present illness 56 yo admitted with right weakness with Left frontal infarct and bil parietal with left carotid stenosis s/p tPA. PMHx: HTN   OT comments  Pt progressing towards acute OT goals. Focus of OT session was RUE AAROM exercises and strategies/AE for ADLs. Pt eager to mobilize, went household distance with walking this session. D/c plan remains appropriate. Updated equipment recommendation to 3n1.   Follow Up Recommendations  Outpatient OT(Neuro Outpatient OT)    Equipment Recommendations  None recommended by OT    Recommendations for Other Services      Precautions / Restrictions Precautions Precautions: None Restrictions Weight Bearing Restrictions: No       Mobility Bed Mobility Overal bed mobility: Needs Assistance Bed Mobility: Supine to Sit     Supine to sit: Min guard;HOB elevated        Transfers Overall transfer level: Needs assistance Equipment used: None Transfers: Sit to/from Stand Sit to Stand: Supervision         General transfer comment: for safety. wide BOS noted    Balance Overall balance assessment: Needs assistance Sitting-balance support: Feet supported;No upper extremity supported Sitting balance-Leahy Scale: Normal     Standing balance support: No upper extremity supported;During functional activity Standing balance-Leahy Scale: Good Standing balance comment: Pt initially using IV pole for balance with ambulation then no AD. Wide BOS during dynamic activities.                            ADL either performed or assessed with clinical judgement   ADL Overall ADL's : Needs assistance/impaired Eating/Feeding: Minimal assistance;Sitting Eating/Feeding Details (indicate cue type and reason): issued AE for feeding and instructed in use. Pt using LUE to assist RUE during feeding tasks.   Grooming: Moderate assistance;Standing Grooming Details (indicate cue type and reason): stood at sink for WB RUE activity. Built up handle for toothbrush utilized. LUE assisting RUE                             Functional mobility during ADLs: Min guard General ADL Comments: Bed mobility, houshehold distance functional mobility at pt's request, and ADL tasks using built up foam for handles. Educatinon on incorporating RUE, safety with sensory deficits, and RUE AAROM exercises.      Vision       Perception     Praxis      Cognition Arousal/Alertness: Awake/alert Behavior During Therapy: WFL for tasks assessed/performed Overall Cognitive Status: Within Functional Limits for tasks assessed                                 General Comments: briefly tearful at times        Exercises Exercises: General Upper Extremity General Exercises - Upper Extremity Shoulder Flexion: AAROM;5 reps;Right;Seated Elbow Flexion: AAROM;Right;5 reps;Seated Elbow Extension: AAROM;Right;5 reps;Seated Wrist Flexion: AAROM;Right;5 reps;Seated Wrist Extension: AAROM;Right;5 reps;Seated   Shoulder Instructions       General Comments      Pertinent Vitals/ Pain       Pain Assessment: No/denies pain  Home Living  Prior Functioning/Environment              Frequency  Min 3X/week        Progress Toward Goals  OT Goals(current goals can now be found in the care plan section)  Progress towards OT goals: Progressing toward goals  Acute Rehab OT Goals Patient Stated Goal: to get better OT Goal Formulation: With patient/family Time For Goal Achievement: 12/31/17 Potential to Achieve Goals: Good ADL Goals Pt Will Perform Eating: with set-up;with adaptive utensils;sitting Pt Will Perform Grooming: with set-up;with supervision;with adaptive equipment;standing Pt Will Perform Upper Body Bathing: with min  assist;standing Pt Will Perform Lower Body Bathing: with min assist;sit to/from stand Pt/caregiver will Perform Home Exercise Program: Right Upper extremity;Independently;With written HEP provided;Increased ROM;Increased strength  Plan Discharge plan remains appropriate    Co-evaluation                 AM-PAC PT "6 Clicks" Daily Activity     Outcome Measure   Help from another person eating meals?: A Little Help from another person taking care of personal grooming?: A Lot Help from another person toileting, which includes using toliet, bedpan, or urinal?: A Little Help from another person bathing (including washing, rinsing, drying)?: A Lot Help from another person to put on and taking off regular upper body clothing?: A Lot Help from another person to put on and taking off regular lower body clothing?: A Lot 6 Click Score: 14    End of Session    OT Visit Diagnosis: Unsteadiness on feet (R26.81);Muscle weakness (generalized) (M62.81);Hemiplegia and hemiparesis Hemiplegia - Right/Left: Right Hemiplegia - dominant/non-dominant: Dominant   Activity Tolerance Patient tolerated treatment well   Patient Left in chair;with call bell/phone within reach;with chair alarm set   Nurse Communication          Time: 8295-6213 OT Time Calculation (min): 36 min  Charges: OT General Charges $OT Visit: 1 Visit OT Treatments $Self Care/Home Management : 8-22 mins $Therapeutic Exercise: 8-22 mins     Pilar Grammes 12/18/2017, 11:11 AM

## 2017-12-18 NOTE — Progress Notes (Signed)
  Progress Note    12/18/2017 8:59 AM * No surgery date entered *  Subjective:  No new issues  Vitals:   12/18/17 0044 12/18/17 0508  BP: (!) 105/55 (!) 112/59  Pulse: (!) 53 (!) 55  Resp: 18 18  Temp: 98.9 F (37.2 C) 98.5 F (36.9 C)  SpO2: 100% 100%    Physical Exam: aaox3 Dysarthria mildly improved Persistent right arm weakness  CBC    Component Value Date/Time   WBC 15.3 (H) 12/15/2017 2335   RBC 5.00 12/15/2017 2335   HGB 15.3 12/15/2017 2351   HCT 45.0 12/15/2017 2351   PLT 337 12/15/2017 2335   MCV 83.4 12/15/2017 2335   MCH 29.8 12/15/2017 2335   MCHC 35.7 12/15/2017 2335   RDW 13.4 12/15/2017 2335   LYMPHSABS 3.1 12/15/2017 2335   MONOABS 1.5 (H) 12/15/2017 2335   EOSABS 0.2 12/15/2017 2335   BASOSABS 0.0 12/15/2017 2335    BMET    Component Value Date/Time   NA 135 12/16/2017 0930   K 4.0 12/16/2017 0930   CL 103 12/16/2017 0930   CO2 25 12/16/2017 0930   GLUCOSE 92 12/16/2017 0930   BUN 11 12/16/2017 0930   CREATININE 1.34 (H) 12/16/2017 0930   CALCIUM 9.0 12/16/2017 0930   GFRNONAA 57 (L) 12/16/2017 0930   GFRAA >60 12/16/2017 0930    INR    Component Value Date/Time   INR 1.07 12/15/2017 2335     Intake/Output Summary (Last 24 hours) at 12/18/2017 0859 Last data filed at 12/18/2017 0700 Gross per 24 hour  Intake -  Output 600 ml  Net -600 ml     Assessment:  56 y.o. male is here with symptomatic left carotid stenosis.   Plan: OR for left CEA on Monday with Dr. Imogene Burnhen. NPO past midnight Sunday. We have discussed the risks and benefits and he is willing to proceed.    Keyandre Pileggi C. Randie Heinzain, MD [facility] Office: [phone] Pager: [phone]  12/18/2017 8:59 AM

## 2017-12-18 NOTE — Progress Notes (Signed)
Physical Therapy Treatment Patient Details Name: Jimmy CrumbleDavid B Hargett MRN: [id number] DOB: 03/19/1961 Today's Date: 12/18/2017    History of Present Illness 56 yo admitted with right weakness with Left frontal infarct and bil parietal with left carotid stenosis s/p tPA. PMHx: HTN    PT Comments    Pt is making good progress towards his goals, however continues to be limited in his safe mobility by decreased R sided strength. Pt is currently mod I for transfers, and min guard for ambulation of 400 feet without AD, and ascend/descend of 10 steps. D/C plans remain appropriate. PT will continue to follow acutely.  Follow Up Recommendations  Outpatient PT;Supervision for mobility/OOB     Equipment Recommendations  None recommended by PT    Recommendations for Other Services       Precautions / Restrictions Precautions Precautions: None Restrictions Weight Bearing Restrictions: No    Mobility  Bed Mobility     General bed mobility comments: in bathroom on entry  Transfers Overall transfer level: Needs assistance Equipment used: None Transfers: Sit to/from Stand Sit to Stand: Modified independent (Device/Increase time)         General transfer comment: good eccentric control into chair with use of handrails  Ambulation/Gait Ambulation/Gait assistance: Min guard Ambulation Distance (Feet): 400 Feet Assistive device: None Gait Pattern/deviations: Step-through pattern;Decreased stride length;Narrow base of support;Antalgic;Decreased weight shift to right Gait velocity: slowed Gait velocity interpretation: Below normal speed for age/gender General Gait Details: slow, steady gait with increase in long standing L knee pain, with last 100 feet of ambulation pt became fatigued and exhibited decreased foot clearance.    Stairs Stairs: Yes   Stair Management: One rail Right;Step to pattern;Forwards Number of Stairs: 10 General stair comments: vc for sequencing, pt tried ascending  with L foot first but increased L knee pain caused him to switch to R LE first and was able to descent steadily  Wheelchair Mobility    Modified Rankin (Stroke Patients Only) Modified Rankin (Stroke Patients Only) Pre-Morbid Rankin Score: No symptoms Modified Rankin: Moderately severe disability     Balance Overall balance assessment: Needs assistance Sitting-balance support: Feet supported;No upper extremity supported Sitting balance-Leahy Scale: Normal     Standing balance support: No upper extremity supported;During functional activity Standing balance-Leahy Scale: Good Standing balance comment: able to weightshift but not easily reach outside his BoS                            Cognition Arousal/Alertness: Awake/alert Behavior During Therapy: WFL for tasks assessed/performed Overall Cognitive Status: Within Functional Limits for tasks assessed                                 General Comments: briefly tearful at times      Exercises General Exercises - Upper Extremity Shoulder Flexion: AAROM;5 reps;Right;Seated Elbow Flexion: AAROM;Right;5 reps;Seated Elbow Extension: AAROM;Right;5 reps;Seated Wrist Flexion: AAROM;Right;5 reps;Seated Wrist Extension: AAROM;Right;5 reps;Seated        Pertinent Vitals/Pain Pain Assessment: No/denies pain           PT Goals (current goals can now be found in the care plan section) Acute Rehab PT Goals Patient Stated Goal: to get better PT Goal Formulation: With patient Time For Goal Achievement: 12/31/17 Potential to Achieve Goals: Good Progress towards PT goals: Progressing toward goals    Frequency    Min 3X/week  PT Plan Current plan remains appropriate    Co-evaluation              AM-PAC PT "6 Clicks" Daily Activity  Outcome Measure  Difficulty turning over in bed (including adjusting bedclothes, sheets and blankets)?: None Difficulty moving from lying on back to sitting on  the side of the bed? : A Little Difficulty sitting down on and standing up from a chair with arms (e.g., wheelchair, bedside commode, etc,.)?: A Little Help needed moving to and from a bed to chair (including a wheelchair)?: None Help needed walking in hospital room?: A Little Help needed climbing 3-5 steps with a railing? : A Little 6 Click Score: 20    End of Session Equipment Utilized During Treatment: Gait belt Activity Tolerance: Patient tolerated treatment well Patient left: in chair;with call bell/phone within reach Nurse Communication: Mobility status PT Visit Diagnosis: Other symptoms and signs involving the nervous system (R29.898);Hemiplegia and hemiparesis Hemiplegia - Right/Left: Right Hemiplegia - dominant/non-dominant: Dominant Hemiplegia - caused by: Cerebral infarction     Time: [id number] PT Time Calculation (min) (ACUTE ONLY): 16 min  Charges:  $Gait Training: 8-22 mins                    G Codes:       Rosette Bellavance B. Beverely RisenVan Fleet PT, DPT Acute Rehabilitation  [phone] Pager [phone]     Elon Alaslizabeth B Van Fleet 12/18/2017, 12:59 PM

## 2017-12-18 NOTE — Progress Notes (Signed)
[place] Will give patient  instructions per Shanda BumpsJessica for surgery Monday. Included in AVS.

## 2017-12-18 NOTE — Discharge Summary (Signed)
Stroke Discharge Summary  Patient ID: Jimmy Morales   MRN: [id number]      DOB: 1961-03-09  Date of Admission: 12/15/2017 Date of Discharge: 12/18/2017  Attending Physician:  Marvel Plan, MD, Stroke MD Consultant(s):   Treatment Team:  Maeola Harman, MD  Patient's PCP:  No primary care provider on file.  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 Hypertension    History reviewed. No pertinent surgical history.  Allergies as of 12/18/2017      Reactions   Shellfish Allergy Shortness Of Breath, Swelling      Medication List    STOP taking these medications   Fish Oil 1000 MG Caps   lisinopril-hydrochlorothiazide 10-12.5 MG tablet Commonly known as:  PRINZIDE,ZESTORETIC   rosuvastatin 20 MG tablet Commonly known as:  CRESTOR   sildenafil 100 MG tablet Commonly known as:  VIAGRA     TAKE these medications   aspirin 325 MG EC tablet Take 1 tablet (325 mg total) by mouth daily. Start taking on:  12/19/2017 What changed:    medication strength  how much to take   atorvastatin 80 MG tablet Commonly known as:  LIPITOR Take 1 tablet (80 mg total) by mouth daily at 6 PM.   Cholecalciferol 1000 units capsule Take 1,000 Units by mouth daily.       LABORATORY STUDIES CBC    Component Value Date/Time   WBC 15.3 (H) 12/15/2017 2335   RBC 5.00 12/15/2017 2335   HGB 15.3 12/15/2017 2351   HCT 45.0 12/15/2017 2351   PLT 337 12/15/2017 2335   MCV 83.4 12/15/2017 2335   MCH 29.8 12/15/2017 2335   MCHC 35.7 12/15/2017 2335   RDW 13.4 12/15/2017 2335   LYMPHSABS 3.1 12/15/2017 2335   MONOABS 1.5 (H) 12/15/2017 2335   EOSABS 0.2 12/15/2017 2335   BASOSABS 0.0 12/15/2017 2335    CMP    Component Value Date/Time   NA 135 12/16/2017 0930   K 4.0 12/16/2017 0930   CL 103 12/16/2017 0930   CO2 25 12/16/2017 0930   GLUCOSE 92 12/16/2017 0930   BUN 11 12/16/2017 0930   CREATININE 1.34 (H) 12/16/2017 0930   CALCIUM 9.0 12/16/2017 0930   PROT 6.9 12/15/2017 2335   ALBUMIN 4.0 12/15/2017 2335   AST 24 12/15/2017 2335   ALT 14 (L) 12/15/2017 2335   ALKPHOS 65 12/15/2017 2335   BILITOT 0.9 12/15/2017 2335   GFRNONAA 57 (L) 12/16/2017 0930   GFRAA >60 12/16/2017 0930   COAGS Lab Results  Component Value Date   INR 1.07 12/15/2017   Lipid Panel    Component Value Date/Time   CHOL 181 12/16/2017 0312   TRIG 343 (H) 12/16/2017 0312   HDL 24 (L) 12/16/2017 0312   CHOLHDL 7.5 12/16/2017 0312   VLDL 69 (H) 12/16/2017 0312   LDLCALC 88 12/16/2017 0312   HgbA1C  Lab Results  Component Value Date   HGBA1C 5.6 12/16/2017   Urinalysis No results found for: COLORURINE, APPEARANCEUR, LABSPEC, PHURINE, GLUCOSEU, HGBUR, BILIRUBINUR, KETONESUR, PROTEINUR, UROBILINOGEN, NITRITE, LEUKOCYTESUR Urine Drug Screen     Component Value Date/Time   LABOPIA NONE DETECTED 12/16/2017 1130  COCAINSCRNUR NONE DETECTED 12/16/2017 1130   LABBENZ NONE DETECTED 12/16/2017 1130   AMPHETMU NONE DETECTED 12/16/2017 1130   THCU NONE DETECTED 12/16/2017 1130   LABBARB NONE DETECTED 12/16/2017 1130    Alcohol Level No results found for: ETH   SIGNIFICANT DIAGNOSTIC STUDIES Mri Mra head and Neck Wo Contrast 12/16/2017 IMPRESSION: 1. Multiple punctate foci of acute ischemia within the left frontal lobe and bilateral superior parietal lobes. The largest area of ischemia is along the left precentral gyrus. No hemorrhage or mass effect. 2. Loss of the normal flow related enhancement is of the proximal left internal carotid artery, likely indicating occlusion or critical stenosis. MR angiography may overestimate the degree of stenosis. CT angiography or conventional angiography may  provide more accurate assessment. 3. 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   Ct Head Code Stroke Wo Contrast 12/16/2017 IMPRESSION: 1. No acute hemorrhage or mass effect. 2. 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 3. ASPECTS is 10.   TTE - Left ventricle: The cavity size was normal. Systolic function was normal. The estimated ejection fraction was in the range of 60% to 65%. Wall motion was normal; there were no regional wall motion abnormalities. Left ventricular diastolic function parameters were normal. - Atrial septum: No defect or patent foramen ovale was identified. Impressions: - No cardiac source of emboli was indentified.  Ct Head Wo Contrast 12/17/2017 IMPRESSION: No acute hemorrhage. Unchanged appearance of the brain compared to 12/15/2017 head CT.  Ct Angio Neck W Or Wo Contrast 12/17/2017 IMPRESSION: 1. Approximately 90% stenosis of the proximal left internal carotid artery due to a large amount of mixed calcified and noncalcified atherosclerotic plaque. 2. Less than 50% stenosis of the proximal right internal carotid artery secondary to atherosclerotic calcification. 3. Normal vertebral arteries.     HISTORY OF PRESENT ILLNESS Jimmy Morales is an 56 y.o. male AA RH male with PMH of hypertension, tobacco use presents with sudden onset right-sided weakness and slurred speech.  Patient recalls coming back from work at 9:20 PM where he was perfectly fine. Somewhere around 10:30 to 11 PM after the patient had dinner he noticed his speech became slurred and felt numbness and weakness of his right side. His spouse drove him to the hospital and on triage was stroke alerted. His blood pressure was 120 systolic. His NIHSS was 5. 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 Patient receive TPA at 12:02 AM  Date last known well: 12/15/2017 Time last known well: 10:50 PM tPA Given: yes NIHSS: 5 Baseline MRS 0   HOSPITAL  COURSE Mr. Jimmy Morales is a 56 y.o. male with history of HTN and smoker admitted for right sided weakness and slurry speech. TPA given.    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 Yes; Fluid consistency: Thin   No antithrombotic prior to admission, now on ASA 325.  Patient counseled to be compliant with his antithrombotic medications  Ongoing aggressive stroke risk factor  management  Therapy recommendations:  outpt PT/OT  Disposition:  home  Left ICA stenosis  Confirmed on MRA neck  CTA neck showed left ICA 90% stenosis  VVS consulted for left CEA   Scheduled on 12/21/17  Avoid hypotension  Hypotension  BP at low side  Long term BP goal 130-150 due to left ICA stenosis before revascularization  Hold off all home BP meds  Check BP at home  Orthostatic vital - negative for orthostatic hypotension  Hyperlipidemia  Home meds:  none   LDL 88, goal < 70  Now on lipitor 80mg   Continue statin at discharge  Tobacco abuse  Current smoker - 1PPD  Smoking cessation counseling provided  Pt is willing to quit  Other Stroke Risk Factors  Advanced age  Hx of stroke in imaging - left MCA parietal infarct  Other Active Problems  Elevated Cre - 1.60->1.34   DISCHARGE EXAM Blood pressure (!) 125/59, pulse 61, temperature 98 F (36.7 C), temperature source Oral, resp. rate 18, height 6' (1.829 m), weight 175 lb 14.8 oz (79.8 kg), SpO2 100 %.  General - Well nourished, well developed,  in no apparent distress.  Ophthalmologic - fundi not visualized due to noncooperation.  Cardiovascular - Regular rate and rhythm with no murmur.  Mental Status -  Level of arousal and orientation to place, and person were intact, but not to time. Language including expression, naming, repetition, comprehension was assessed and found intact, moderate to severe dysarthria  Cranial Nerves II - XII - II - Visual field intact OU. III, IV, VI - Extraocular movements intact. V - Facial sensation intact bilaterally. VII - right facial droop. VIII - Hearing & vestibular intact bilaterally. X - Palate elevates symmetrically, moderate to severe dysarthria. XI - Chin turning & shoulder shrug intact bilaterally. XII - Tongue protrusion intact.  Motor Strength - The patient's strength was normal in all extremities except RUE 4/5 deltoid, bicep, tricep, but 3-/5 finger grip and pronator drift was present on the right.  Bulk was normal and fasciculations were absent.   Motor Tone - Muscle tone was assessed at the neck and appendages and was normal.  Reflexes - The patient's reflexes were symmetrical in all extremities and he had no pathological reflexes.  Sensory - Light touch, temperature/pinprick were assessed and were symmetrical.    Coordination - The patient had normal movements in the hands with no ataxia or dysmetria.  Tremor was absent.  Gait and Station - slow pace but normal gait.    Discharge Diet   Diet regular Room service appropriate? Yes; Fluid consistency: Thin Diet - low sodium heart healthy liquids  DISCHARGE PLAN  Disposition:  home  aspirin 325 mg daily for secondary stroke prevention.  Ongoing risk factor control by Primary Care Physician at time of discharge  Follow-up with PCP in 2 weeks.  Follow-up with Stroke Clinic in 6 weeks, office to schedule an appointment.  Follow up with [facility] for left CEA  Discharge instructions: Please quit smoking Please  come back on Monday 12/21/17 for left carotid surgery. Keep nothing by mouth after midnight at Sunday night.  Will refer you to outpatient therapy for right sided weakness, you will be called for those appointment Keep mainly bed rest for the next several days until you have surgery done. Drink plenty of fluid to avoid low BP Check your BP at home and BP can not be too low. Higher number should not lower than 105. I have stopped your BP  meds, your BP was on the low side in the hospital.  We will call you for appointment to follow up with us in neurology clinic in 6 weeks.  Continue the medication as prescribed, no missing dose.  Healthy diet, follow up with your primary care doctors in 2 weeks after discharge.  Follow up with vascular surgery as scheduled.   40 minutes were spent preparing discharge.  Marvel PlanJindong Vail Vuncannon, MD PhD Stroke Neurology 12/18/2017 12:10 PM

## 2017-12-18 NOTE — Care Management Note (Signed)
Case Management Note  Patient Details  Name: Jimmy Morales MRN: [id number] Date of Birth: 02/05/1961  Subjective/Objective:                Admitted with right weakness with Left frontal infarct and bil parietal with left carotid stenosis.     Action/Plan: Transition to home with Outpatient PT and OT to follow.  Expected Discharge Date:  12/18/17               Expected Discharge Plan:  Home/Self Care  In-House Referral:     Discharge planning Services  CM Consult  Post Acute Care Choice:    Choice offered to:     DME Arranged:    DME Agency:     HH Arranged:    HH Agency:     Status of Service:  Completed, signed off  If discussed at [employer] Length of Stay Meetings, dates discussed:    Additional Comments:  Epifanio LeschesCole, Jermisha Hoffart Hudson, RN 12/18/2017, 2:10 PM

## 2017-12-20 NOTE — Anesthesia Preprocedure Evaluation (Addendum)
Anesthesia Evaluation  Patient identified by MRN, date of birth, ID band Patient awake    Reviewed: Allergy & Precautions, NPO status , Patient's Chart, lab work & pertinent test results  History of Anesthesia Complications (+) PONV  Airway Mallampati: II  TM Distance: >3 FB Neck ROM: Full    Dental no notable dental hx. (+) Dental Advisory Given, Missing, Poor Dentition   Pulmonary neg pulmonary ROS, Current Smoker,    Pulmonary exam normal        Cardiovascular hypertension, Normal cardiovascular exam  Study Conclusions  - Left ventricle: The cavity size was normal. Systolic function was   normal. The estimated ejection fraction was in the range of 60%   to 65%. Wall motion was normal; there were no regional wall   motion abnormalities.   Neuro/Psych CVA negative psych ROS   GI/Hepatic negative GI ROS, Neg liver ROS,   Endo/Other  negative endocrine ROS  Renal/GU negative Renal ROS     Musculoskeletal negative musculoskeletal ROS (+)   Abdominal   Peds  Hematology negative hematology ROS (+)   Anesthesia Other Findings Day of surgery medications reviewed with the patient.  Reproductive/Obstetrics                           Anesthesia Physical Anesthesia Plan  ASA: III  Anesthesia Plan: General   Post-op Pain Management:    Induction: Intravenous  PONV Risk Score and Plan: 2 and Ondansetron and Dexamethasone  Airway Management Planned: Oral ETT  Additional Equipment: Arterial line  Intra-op Plan:   Post-operative Plan: Extubation in OR  Informed Consent: I have reviewed the patients History and Physical, chart, labs and discussed the procedure including the risks, benefits and alternatives for the proposed anesthesia with the patient or authorized representative who has indicated his/her understanding and acceptance.   Dental advisory given  Plan Discussed with:  Anesthesiologist and CRNA  Anesthesia Plan Comments:        Anesthesia Quick Evaluation

## 2017-12-21 ENCOUNTER — Other Ambulatory Visit: Payer: Self-pay

## 2017-12-21 ENCOUNTER — Inpatient Hospital Stay (HOSPITAL_COMMUNITY)
Admission: RE | Admit: 2017-12-21 | Discharge: 2017-12-22 | DRG: 027 | Disposition: A | Discharge status: Home / Self Care | Payer: Self-pay | Source: Ambulatory Visit | Attending: Vascular Surgery | Admitting: Vascular Surgery

## 2017-12-21 ENCOUNTER — Inpatient Hospital Stay (HOSPITAL_COMMUNITY): Payer: Self-pay | Admitting: Anesthesiology

## 2017-12-21 ENCOUNTER — Encounter (HOSPITAL_COMMUNITY): Payer: Self-pay | Admitting: Certified Registered Nurse Anesthetist

## 2017-12-21 ENCOUNTER — Encounter (HOSPITAL_COMMUNITY)
Admission: RE | Disposition: A | Discharge status: Home / Self Care | Payer: Self-pay | Source: Ambulatory Visit | Attending: Vascular Surgery

## 2017-12-21 DIAGNOSIS — Z8673 Personal history of transient ischemic attack (TIA), and cerebral infarction without residual deficits: Secondary | ICD-10-CM

## 2017-12-21 DIAGNOSIS — I10 Essential (primary) hypertension: Secondary | ICD-10-CM | POA: Diagnosis present

## 2017-12-21 DIAGNOSIS — R29810 Facial weakness: Secondary | ICD-10-CM | POA: Diagnosis present

## 2017-12-21 DIAGNOSIS — I6522 Occlusion and stenosis of left carotid artery: Secondary | ICD-10-CM

## 2017-12-21 DIAGNOSIS — F17200 Nicotine dependence, unspecified, uncomplicated: Secondary | ICD-10-CM | POA: Diagnosis present

## 2017-12-21 DIAGNOSIS — I6529 Occlusion and stenosis of unspecified carotid artery: Secondary | ICD-10-CM | POA: Diagnosis present

## 2017-12-21 DIAGNOSIS — Z91013 Allergy to seafood: Secondary | ICD-10-CM

## 2017-12-21 DIAGNOSIS — E785 Hyperlipidemia, unspecified: Secondary | ICD-10-CM | POA: Diagnosis present

## 2017-12-21 HISTORY — DX: Unspecified osteoarthritis, unspecified site: M19.90

## 2017-12-21 HISTORY — PX: CAROTID ENDARTERECTOMY: SUR193

## 2017-12-21 HISTORY — DX: Other chronic pain: G89.29

## 2017-12-21 HISTORY — DX: Personal history of other diseases of the musculoskeletal system and connective tissue: Z87.39

## 2017-12-21 HISTORY — DX: Unspecified asthma, uncomplicated: J45.909

## 2017-12-21 HISTORY — PX: ENDARTERECTOMY: SHX5162

## 2017-12-21 HISTORY — DX: Low back pain, unspecified: M54.50

## 2017-12-21 HISTORY — DX: Occlusion and stenosis of left carotid artery: I65.22

## 2017-12-21 HISTORY — DX: Low back pain: M54.5

## 2017-12-21 HISTORY — DX: Pure hypercholesterolemia, unspecified: E78.00

## 2017-12-21 LAB — TYPE AND SCREEN
ABO/RH(D): O POS
Antibody Screen: NEGATIVE

## 2017-12-21 LAB — CBC
HEMATOCRIT: 35.1 % — AB (ref 39.0–52.0)
Hemoglobin: 12.5 g/dL — ABNORMAL LOW (ref 13.0–17.0)
MCH: 29.1 pg (ref 26.0–34.0)
MCHC: 35.6 g/dL (ref 30.0–36.0)
MCV: 81.8 fL (ref 78.0–100.0)
Platelets: 266 10*3/uL (ref 150–400)
RBC: 4.29 MIL/uL (ref 4.22–5.81)
RDW: 12.7 % (ref 11.5–15.5)
WBC: 11.7 10*3/uL — ABNORMAL HIGH (ref 4.0–10.5)

## 2017-12-21 LAB — CREATININE, SERUM
Creatinine, Ser: 1.29 mg/dL — ABNORMAL HIGH (ref 0.61–1.24)
GFR calc Af Amer: >60 mL/min (ref >60)
GFR calc non Af Amer: 60 mL/min — ABNORMAL LOW (ref >60)

## 2017-12-21 LAB — ABO/RH: ABO/RH(D): O POS

## 2017-12-21 SURGERY — ENDARTERECTOMY, CAROTID
Anesthesia: General | Site: Neck | Laterality: Left

## 2017-12-21 MED ORDER — ATORVASTATIN CALCIUM 80 MG PO TABS
80.0000 mg | ORAL_TABLET | Freq: Every day | ORAL | Status: DC
  Administered 2017-12-21: 80 mg via ORAL
  Filled 2017-12-21: qty 1

## 2017-12-21 MED ORDER — PHENOL 1.4 % MT LIQD: 1.0000 | OROMUCOSAL | Status: DC | PRN

## 2017-12-21 MED ORDER — ALUM & MAG HYDROXIDE-SIMETH 200-200-20 MG/5ML PO SUSP: 15.0000 mL | ORAL | Status: DC | PRN

## 2017-12-21 MED ORDER — SODIUM CHLORIDE 0.9 % IV SOLN
0.0125 ug/kg/min | INTRAVENOUS | Status: DC
  Filled 2017-12-21: qty 1000

## 2017-12-21 MED ORDER — CHLORHEXIDINE GLUCONATE 4 % EX LIQD: 60.0000 mL | Freq: Once | CUTANEOUS | Status: DC

## 2017-12-21 MED ORDER — SENNOSIDES-DOCUSATE SODIUM 8.6-50 MG PO TABS: 1.0000 | ORAL_TABLET | Freq: Every evening | ORAL | Status: DC | PRN

## 2017-12-21 MED ORDER — HEPARIN SODIUM (PORCINE) 1000 UNIT/ML IJ SOLN
INTRAMUSCULAR | Status: AC
  Filled 2017-12-21: qty 1

## 2017-12-21 MED ORDER — MORPHINE SULFATE (PF) 2 MG/ML IV SOLN: 1.0000 mg | INTRAVENOUS | Status: DC | PRN

## 2017-12-21 MED ORDER — ONDANSETRON HCL 4 MG/2ML IJ SOLN: 4.0000 mg | Freq: Four times a day (QID) | INTRAMUSCULAR | Status: DC | PRN

## 2017-12-21 MED ORDER — PANTOPRAZOLE SODIUM 40 MG PO TBEC
40.0000 mg | DELAYED_RELEASE_TABLET | Freq: Every day | ORAL | Status: DC
  Filled 2017-12-21: qty 1

## 2017-12-21 MED ORDER — DOCUSATE SODIUM 100 MG PO CAPS
100.0000 mg | ORAL_CAPSULE | Freq: Every day | ORAL | Status: DC
  Filled 2017-12-21: qty 1

## 2017-12-21 MED ORDER — DEXTRAN 40 IN D5W 10 % IV SOLN
INTRAVENOUS | Status: AC
  Filled 2017-12-21: qty 500

## 2017-12-21 MED ORDER — HEPARIN SODIUM (PORCINE) 1000 UNIT/ML IJ SOLN
INTRAMUSCULAR | Status: DC | PRN
  Administered 2017-12-21 (×2): 8000 [IU] via INTRAVENOUS
  Administered 2017-12-21: 2000 [IU] via INTRAVENOUS

## 2017-12-21 MED ORDER — PROPOFOL 10 MG/ML IV BOLUS
INTRAVENOUS | Status: AC
  Filled 2017-12-21: qty 20

## 2017-12-21 MED ORDER — ONDANSETRON HCL 4 MG/2ML IJ SOLN
INTRAMUSCULAR | Status: DC | PRN
  Administered 2017-12-21: 4 mg via INTRAVENOUS

## 2017-12-21 MED ORDER — PROPOFOL 10 MG/ML IV BOLUS
INTRAVENOUS | Status: DC | PRN
  Administered 2017-12-21: 150 mg via INTRAVENOUS
  Administered 2017-12-21: 50 mg via INTRAVENOUS

## 2017-12-21 MED ORDER — HYDROMORPHONE HCL 1 MG/ML IJ SOLN: 0.2500 mg | INTRAMUSCULAR | Status: DC | PRN

## 2017-12-21 MED ORDER — POTASSIUM CHLORIDE CRYS ER 20 MEQ PO TBCR: 20.0000 meq | EXTENDED_RELEASE_TABLET | Freq: Every day | ORAL | Status: DC | PRN

## 2017-12-21 MED ORDER — SODIUM CHLORIDE 0.9 % IJ SOLN
INTRAMUSCULAR | Status: AC
  Filled 2017-12-21: qty 20

## 2017-12-21 MED ORDER — LACTATED RINGERS IV SOLN
INTRAVENOUS | Status: DC | PRN
Start: 2017-12-21 — End: 2017-12-21
  Administered 2017-12-21: 07:00:00 via INTRAVENOUS

## 2017-12-21 MED ORDER — LACTATED RINGERS IV SOLN
INTRAVENOUS | Status: DC | PRN
  Administered 2017-12-21: 07:00:00 via INTRAVENOUS

## 2017-12-21 MED ORDER — OXYCODONE HCL 5 MG PO TABS: 5.0000 mg | ORAL_TABLET | ORAL | Status: DC | PRN

## 2017-12-21 MED ORDER — BISACODYL 5 MG PO TBEC: 5.0000 mg | DELAYED_RELEASE_TABLET | Freq: Every day | ORAL | Status: DC | PRN

## 2017-12-21 MED ORDER — 0.9 % SODIUM CHLORIDE (POUR BTL) OPTIME
TOPICAL | Status: DC | PRN
  Administered 2017-12-21: 2000 mL

## 2017-12-21 MED ORDER — VITAMIN D3 25 MCG (1000 UNIT) PO TABS
1000.0000 [IU] | ORAL_TABLET | Freq: Every day | ORAL | Status: DC
  Administered 2017-12-22: 1000 [IU] via ORAL
  Filled 2017-12-21 (×2): qty 1

## 2017-12-21 MED ORDER — REMIFENTANIL HCL 1 MG IV SOLR
0.0125 ug/kg/min | INTRAVENOUS | Status: AC
  Administered 2017-12-21: .1 ug/kg/min via INTRAVENOUS
  Filled 2017-12-21: qty 2000

## 2017-12-21 MED ORDER — LABETALOL HCL 5 MG/ML IV SOLN
INTRAVENOUS | Status: DC | PRN
  Administered 2017-12-21: 5 mg via INTRAVENOUS

## 2017-12-21 MED ORDER — PROTAMINE SULFATE 10 MG/ML IV SOLN
INTRAVENOUS | Status: AC
  Filled 2017-12-21: qty 5

## 2017-12-21 MED ORDER — MAGNESIUM SULFATE 2 GM/50ML IV SOLN
2.0000 g | Freq: Every day | INTRAVENOUS | Status: DC | PRN
  Filled 2017-12-21: qty 50

## 2017-12-21 MED ORDER — LIDOCAINE HCL (PF) 1 % IJ SOLN
INTRAMUSCULAR | Status: AC
  Filled 2017-12-21: qty 30

## 2017-12-21 MED ORDER — ROCURONIUM BROMIDE 10 MG/ML (PF) SYRINGE
PREFILLED_SYRINGE | INTRAVENOUS | Status: DC | PRN
  Administered 2017-12-21 (×2): 15 mg via INTRAVENOUS
  Administered 2017-12-21: 70 mg via INTRAVENOUS

## 2017-12-21 MED ORDER — GUAIFENESIN-DM 100-10 MG/5ML PO SYRP: 15.0000 mL | ORAL_SOLUTION | ORAL | Status: DC | PRN

## 2017-12-21 MED ORDER — HEMOSTATIC AGENTS (NO CHARGE) OPTIME
TOPICAL | Status: DC | PRN
  Administered 2017-12-21: 1 via TOPICAL

## 2017-12-21 MED ORDER — DEXTRAN 40 IN SALINE 10-0.9 % IV SOLN
INTRAVENOUS | Status: AC | PRN
Start: 2017-12-21 — End: 2017-12-21
  Administered 2017-12-21: 500 mL

## 2017-12-21 MED ORDER — SODIUM CHLORIDE 0.9 % IV SOLN: 500.0000 mL | Freq: Once | INTRAVENOUS | Status: DC | PRN

## 2017-12-21 MED ORDER — FENTANYL CITRATE (PF) 250 MCG/5ML IJ SOLN
INTRAMUSCULAR | Status: DC | PRN
  Administered 2017-12-21: 100 ug via INTRAVENOUS

## 2017-12-21 MED ORDER — LABETALOL HCL 5 MG/ML IV SOLN: 10.0000 mg | INTRAVENOUS | Status: DC | PRN

## 2017-12-21 MED ORDER — SODIUM CHLORIDE 0.9 % IV SOLN: INTRAVENOUS | Status: DC

## 2017-12-21 MED ORDER — PROTAMINE SULFATE 10 MG/ML IV SOLN
INTRAVENOUS | Status: DC | PRN
  Administered 2017-12-21: 50 mg via INTRAVENOUS
  Administered 2017-12-21: 60 mg via INTRAVENOUS

## 2017-12-21 MED ORDER — HEPARIN SODIUM (PORCINE) 5000 UNIT/ML IJ SOLN
INTRAMUSCULAR | Status: DC | PRN
  Administered 2017-12-21: 08:00:00

## 2017-12-21 MED ORDER — METOPROLOL TARTRATE 5 MG/5ML IV SOLN: 2.0000 mg | INTRAVENOUS | Status: DC | PRN

## 2017-12-21 MED ORDER — PROMETHAZINE HCL 25 MG/ML IJ SOLN
INTRAMUSCULAR | Status: AC
  Administered 2017-12-21: 12.5 mg via INTRAVENOUS
  Filled 2017-12-21: qty 1

## 2017-12-21 MED ORDER — ENOXAPARIN SODIUM 30 MG/0.3ML ~~LOC~~ SOLN: 30.0000 mg | SUBCUTANEOUS | Status: DC

## 2017-12-21 MED ORDER — OXYCODONE HCL 5 MG PO TABS: 5.0000 mg | ORAL_TABLET | Freq: Four times a day (QID) | ORAL | 0 refills | Status: AC | PRN

## 2017-12-21 MED ORDER — ASPIRIN EC 325 MG PO TBEC
325.0000 mg | DELAYED_RELEASE_TABLET | Freq: Every day | ORAL | Status: DC
  Administered 2017-12-22: 325 mg via ORAL
  Filled 2017-12-21: qty 1

## 2017-12-21 MED ORDER — ACETAMINOPHEN 325 MG RE SUPP
325.0000 mg | RECTAL | Status: DC | PRN
  Filled 2017-12-21: qty 2

## 2017-12-21 MED ORDER — MIDAZOLAM HCL 2 MG/2ML IJ SOLN
INTRAMUSCULAR | Status: AC
  Filled 2017-12-21: qty 2

## 2017-12-21 MED ORDER — GLYCOPYRROLATE 0.2 MG/ML IJ SOLN
INTRAMUSCULAR | Status: DC | PRN
  Administered 2017-12-21 (×2): 0.1 mg via INTRAVENOUS

## 2017-12-21 MED ORDER — SODIUM CHLORIDE 0.9 % IV SOLN
INTRAVENOUS | Status: DC
Start: 2017-12-21 — End: 2017-12-21

## 2017-12-21 MED ORDER — PHENYLEPHRINE HCL 10 MG/ML IJ SOLN
INTRAVENOUS | Status: DC | PRN
  Administered 2017-12-21: 20 ug/min via INTRAVENOUS

## 2017-12-21 MED ORDER — ACETAMINOPHEN 325 MG PO TABS: 325.0000 mg | ORAL_TABLET | ORAL | Status: DC | PRN

## 2017-12-21 MED ORDER — EPINEPHRINE PF 1 MG/10ML IJ SOSY
PREFILLED_SYRINGE | INTRAMUSCULAR | Status: AC
  Filled 2017-12-21: qty 20

## 2017-12-21 MED ORDER — CEFUROXIME SODIUM 1.5 G IV SOLR
1.5000 g | INTRAVENOUS | Status: AC
  Administered 2017-12-21: 1.5 g via INTRAVENOUS
  Filled 2017-12-21 (×2): qty 1.5

## 2017-12-21 MED ORDER — SUGAMMADEX SODIUM 200 MG/2ML IV SOLN
INTRAVENOUS | Status: DC | PRN
  Administered 2017-12-21: 100 mg via INTRAVENOUS

## 2017-12-21 MED ORDER — FENTANYL CITRATE (PF) 250 MCG/5ML IJ SOLN
INTRAMUSCULAR | Status: AC
  Filled 2017-12-21: qty 5

## 2017-12-21 MED ORDER — DEXTROSE 5 % IV SOLN
1.5000 g | Freq: Two times a day (BID) | INTRAVENOUS | Status: AC
  Administered 2017-12-21 – 2017-12-22 (×2): 1.5 g via INTRAVENOUS
  Filled 2017-12-21 (×2): qty 1.5

## 2017-12-21 MED ORDER — LIDOCAINE 2% (20 MG/ML) 5 ML SYRINGE
INTRAMUSCULAR | Status: DC | PRN
  Administered 2017-12-21: 100 mg via INTRAVENOUS

## 2017-12-21 MED ORDER — PROMETHAZINE HCL 25 MG/ML IJ SOLN
6.2500 mg | INTRAMUSCULAR | Status: DC | PRN
  Administered 2017-12-21: 12.5 mg via INTRAVENOUS

## 2017-12-21 MED ORDER — HYDRALAZINE HCL 20 MG/ML IJ SOLN: 5.0000 mg | INTRAMUSCULAR | Status: DC | PRN

## 2017-12-21 SURGICAL SUPPLY — 56 items
ADH SKN CLS APL DERMABOND .7 (GAUZE/BANDAGES/DRESSINGS) ×1
ADH SKN CLS LQ APL DERMABOND (GAUZE/BANDAGES/DRESSINGS) ×1
ADPR TBG 2 MALE LL ART (MISCELLANEOUS) ×1
AGENT HMST SPONGE THK3/8 (HEMOSTASIS) ×1
BAG DECANTER FOR FLEXI CONT (MISCELLANEOUS) ×2 IMPLANT
CANISTER SUCT 3000ML PPV (MISCELLANEOUS) ×2 IMPLANT
CANNULA VESSEL 3MM 2 BLNT TIP (CANNULA) ×1 IMPLANT
CATH ROBINSON RED A/P 18FR (CATHETERS) ×2 IMPLANT
CLIP VESOCCLUDE MED 24/CT (CLIP) ×2 IMPLANT
CLIP VESOCCLUDE SM WIDE 24/CT (CLIP) ×2 IMPLANT
COVER PROBE W GEL 5X96 (DRAPES) IMPLANT
CRADLE DONUT ADULT HEAD (MISCELLANEOUS) ×2 IMPLANT
DERMABOND ADHESIVE PROPEN (GAUZE/BANDAGES/DRESSINGS) ×1
DERMABOND ADVANCED (GAUZE/BANDAGES/DRESSINGS) ×1
DERMABOND ADVANCED .7 DNX12 (GAUZE/BANDAGES/DRESSINGS) ×1 IMPLANT
DERMABOND ADVANCED .7 DNX6 (GAUZE/BANDAGES/DRESSINGS) IMPLANT
ELECT REM PT RETURN 9FT ADLT (ELECTROSURGICAL) ×2
ELECTRODE REM PT RTRN 9FT ADLT (ELECTROSURGICAL) ×1 IMPLANT
GAUZE SPONGE 4X4 12PLY STRL LF (GAUZE/BANDAGES/DRESSINGS) ×1 IMPLANT
GLOVE BIO SURGEON STRL SZ7 (GLOVE) ×3 IMPLANT
GLOVE BIOGEL PI IND STRL 6.5 (GLOVE) IMPLANT
GLOVE BIOGEL PI IND STRL 7.5 (GLOVE) ×1 IMPLANT
GLOVE BIOGEL PI INDICATOR 6.5 (GLOVE) ×2
GLOVE BIOGEL PI INDICATOR 7.5 (GLOVE) ×2
GOWN STRL REUS W/ TWL LRG LVL3 (GOWN DISPOSABLE) ×3 IMPLANT
GOWN STRL REUS W/TWL LRG LVL3 (GOWN DISPOSABLE) ×6
HEMOSTAT SPONGE AVITENE ULTRA (HEMOSTASIS) ×1 IMPLANT
IV ADAPTER SYR DOUBLE MALE LL (MISCELLANEOUS) ×1 IMPLANT
KIT BASIN OR (CUSTOM PROCEDURE TRAY) ×2 IMPLANT
KIT ROOM TURNOVER OR (KITS) ×2 IMPLANT
KIT SHUNT ARGYLE CAROTID ART 6 (VASCULAR PRODUCTS) ×1 IMPLANT
NDL HYPO 25GX1X1/2 BEV (NEEDLE) IMPLANT
NEEDLE HYPO 25GX1X1/2 BEV (NEEDLE) IMPLANT
NS IRRIG 1000ML POUR BTL (IV SOLUTION) ×6 IMPLANT
PACK CAROTID (CUSTOM PROCEDURE TRAY) ×2 IMPLANT
PAD ARMBOARD 7.5X6 YLW CONV (MISCELLANEOUS) ×4 IMPLANT
SET COLLECT BLD 21X3/4 12 PB (MISCELLANEOUS) ×1 IMPLANT
SHUNT CAROTID BYPASS 10 (VASCULAR PRODUCTS) IMPLANT
SHUNT CAROTID BYPASS 12FRX15.5 (VASCULAR PRODUCTS) IMPLANT
SPONGE INTESTINAL PEANUT (DISPOSABLE) ×1 IMPLANT
SPONGE LAP 18X18 X RAY DECT (DISPOSABLE) ×1 IMPLANT
SUT ETHILON 3 0 PS 1 (SUTURE) ×1 IMPLANT
SUT MNCRL AB 4-0 PS2 18 (SUTURE) ×2 IMPLANT
SUT PROLENE 6 0 BV (SUTURE) ×4 IMPLANT
SUT PROLENE 7 0 BV 1 (SUTURE) IMPLANT
SUT SILK 3 0 (SUTURE)
SUT SILK 3-0 18XBRD TIE 12 (SUTURE) IMPLANT
SUT VIC AB 3-0 SH 27 (SUTURE) ×2
SUT VIC AB 3-0 SH 27X BRD (SUTURE) ×1 IMPLANT
SYR 20CC LL (SYRINGE) ×2 IMPLANT
SYR TB 1ML LUER SLIP (SYRINGE) IMPLANT
SYSTEM CHEST DRAIN TLS 7FR (DRAIN) ×1 IMPLANT
TAPE CLOTH SURG 4X10 WHT LF (GAUZE/BANDAGES/DRESSINGS) ×1 IMPLANT
TOWEL GREEN STERILE (TOWEL DISPOSABLE) ×2 IMPLANT
TUBING ART PRESS 48 MALE/FEM (TUBING) ×1 IMPLANT
WATER STERILE IRR 1000ML POUR (IV SOLUTION) ×2 IMPLANT

## 2017-12-21 NOTE — Discharge Instructions (Signed)
   [facility]  Discharge Instructions   Carotid Endarterectomy (CEA)  Please refer to the following instructions for your post-procedure care. Your surgeon or physician assistant will discuss any changes with you.  Activity  You are encouraged to walk as much as you can. You can slowly return to normal activities but must avoid strenuous activity and heavy lifting until your doctor tell you it's OK. Avoid activities such as vacuuming or swinging a golf club. You can drive after one week if you are comfortable and you are no longer taking prescription pain medications. It is normal to feel tired for serval weeks after your surgery. It is also normal to have difficulty with sleep habits, eating, and bowel movements after surgery. These will go away with time.  Bathing/Showering  You may shower after you come home. Do not soak in a bathtub, hot tub, or swim until the incision heals completely.  Incision Care  Shower every day. Clean your incision with mild soap and water. Pat the area dry with a clean towel. You do not need a bandage unless otherwise instructed. Do not apply any ointments or creams to your incision. You may have skin glue on your incision. Do not peel it off. It will come off on its own in about one week. Your incision may feel thickened and raised for several weeks after your surgery. This is normal and the skin will soften over time. For Men Only: It's OK to shave around the incision but do not shave the incision itself for 2 weeks. It is common to have numbness under your chin that could last for several months.  Diet  Resume your normal diet. There are no special food restrictions following this procedure. A low fat/low cholesterol diet is recommended for all patients with vascular disease. In order to heal from your surgery, it is CRITICAL to get adequate nutrition. Your body requires vitamins, minerals, and protein. Vegetables are the best  source of vitamins and minerals. Vegetables also provide the perfect balance of protein. Processed food has little nutritional value, so try to avoid this.        Medications  Resume taking all of your medications unless your doctor or physician assistant tells you not to. If your incision is causing pain, you may take over-the- counter pain relievers such as acetaminophen (Tylenol). If you were prescribed a stronger pain medication, please be aware these medications can cause nausea and constipation. Prevent nausea by taking the medication with a snack or meal. Avoid constipation by drinking plenty of fluids and eating foods with a high amount of fiber, such as fruits, vegetables, and grains. Do not take Tylenol if you are taking prescription pain medications.  Follow Up  Our office will schedule a follow up appointment 2-3 weeks following discharge.  Please call us immediately for any of the following conditions  Increased pain, redness, drainage (pus) from your incision site. Fever of 101 degrees or higher. If you should develop stroke (slurred speech, difficulty swallowing, weakness on one side of your body, loss of vision) you should call 911 and go to the nearest emergency room.  Reduce your risk of vascular disease:  Stop smoking. If you would like help call [facility] at 1-800-QUIT-NOW (1-800-784-8669) or [place] at [phone]. Manage your cholesterol Maintain a desired weight Control your diabetes Keep your blood pressure down  If you have any questions, please call the office at [phone].   

## 2017-12-21 NOTE — Interval H&P Note (Signed)
   History and Physical Update  The patient was interviewed and re-examined.  The patient's previous History and Physical has been reviewed and is unchanged from Dr. Darcella Cheshireain's consult.  There is no change in the plan of care: L CEA.   I discussed with the patient the risks, benefits, and alternatives to carotid endarterectomy.    The patient is aware that the risks of carotid endarterectomy include but are not limited to: bleeding, infection, stroke, myocardial infarction, death, cranial nerve injuries both temporary and permanent, neck hematoma, possible airway compromise, labile blood pressure post-operatively, cerebral hyperperfusion syndrome, and possible need for additional interventions in the future.   I emphasized that there is a risk of worsening stroke given the relative proximity procedure to primary stroke event.  I explained that Dr. Randie Heinzain had concerns the L ICA might thrombosis with extended delay, hence, the expedited procedure.  I also discussed the higher position of the L ICA lesion, which increases the risk of cranial nerve injury and possible consequences involved swallow issue potentially requiring PEG placement.   The patient is aware of the risks and agrees to proceed forward with the procedure.   Leonides SakeBrian Chen, MD, FACS [facility] [place] Office: [phone] Pager: [phone]  12/21/2017, 7:25 AM

## 2017-12-21 NOTE — Op Note (Signed)
OPERATIVE NOTE   PROCEDURE:   1.  left carotid endarterectomy with primary repair 2.  left intraoperative carotid ultrasound  PRE-OPERATIVE DIAGNOSIS: left symptomatic carotid stenosis >90%  POST-OPERATIVE DIAGNOSIS: same as above   SURGEON: Leonides Sake, MD  ASSISTANT(S): Lianne Cure, PAC   ANESTHESIA: general  ESTIMATED BLOOD LOSS: 100 cc  FINDING(S): 1.  Continuous Doppler audible flow signatures are appropriate for each carotid artery. 2.  No evidence of intimal flap visualized on transverse or longitudinal ultrasonography of left internal carotid artery  3.  Non-flow limiting flap in left external carotid artery  3.  Carotid plaque: mixed calcific and necrotic core, possible chronic occlusion 4.  Vagus nerve: normal position, slightly anterior distally 5.  Hypoglossal nerve: visualized, did not require mobilization  SPECIMEN(S):  None  INDICATIONS:   Jimmy Morales is a 56 y.o. male who presents with left symptomatic carotid stenosis >90%.  Dr. Randie Heinz had evaluated the patient after his left stroke.  He had concerns the left internal carotid artery might occluded if the near total occlusion in the left internal carotid artery was delayed more than 2 weeks.  I discussed with the patient the risks, benefits, and alternatives to carotid endarterectomy in the peristroke time period.  I discussed the procedural details of carotid endarterectomy with the patient.  The patient is aware that the risks of carotid endarterectomy include but are not limited to: bleeding, infection, stroke, myocardial infarction, death, cranial nerve injuries both temporary and permanent, neck hematoma, possible airway compromise, labile blood pressure post-operatively, cerebral hyperperfusion syndrome, and possible need for additional interventions in the future. The patient is aware of the risks and agrees to proceed forward with the procedure.  DESCRIPTION: After full informed written consent was  obtained from the patient, the patient was brought back to the operating room and placed supine upon the operating table.  Prior to induction, the patient received IV antibiotics.  After obtaining adequate anesthesia, the patient was placed into semi-Fowler position with a shoulder roll in place and the patient's neck slightly hyperextended and rotated away from the surgical site.    The patient was prepped in the standard fashion for a left carotid endarterectomy.  I made an incision anterior to the sternocleidomastoid muscle and dissected down through the subcutaneous tissue.  The platysmas was opened with electrocautery.  Then I dissected down to the internal jugular vein.  This was dissected posteriorly until I obtained visualization of the common carotid artery.  This was dissected out and then an umbilical tape was placed around the common carotid artery and I loosely applied a Rumel tourniquet.  I then dissected in a periadventitial fashion along the common carotid artery up to the bifurcation.  I then identified the external carotid artery and the superior thyroid artery.  A 2-0 silk tie was looped around the superior thyroid artery, and I also dissected out the external carotid artery and placed a vessel loop around it.  In continuing the dissection to the internal carotid artery, I identified the facial vein, which had multiple branches.  Each branch was ligated and then transected, giving me improved exposure of the internal carotid artery.  In the process of this dissection, the hypoglossal nerve was identified.  I then dissected out the internal carotid artery until I identified an area of soft tissue in the internal carotid artery.  Due to the high extent of the disease bifurcation, I did not think a Rumel tourniquet could be applied to the  internal carotid artery.   I dissected slightly distal to relatively disease free portion of the internal carotid artery and placed a vessel loop around the  artery.    At this point, the patient was given a therapeutic bolus of Heparin intravenously, 8000 units (roughly 100 units/kg).  After waiting 3 minutes, then I clamped the external carotid artery and then the common carotid artery.  Using a butterfly needle connected to the arterial pressure circuit, I cannulated the common carotid artery distal to the common carotid clamp.  The stump pressure was measured at: 40 mm Hg.  Based on this measurement, I felt no shunt was needed.  I then made an arteriotomy in the common carotid artery with a 11 blade, and extended the arteriotomy with a Potts scissor down into the common carotid artery, then I carried the arteriotomy through the bifurcation into the internal carotid artery until I reached an area that was not diseased.  There was nearly no lumen extending from the carotid bifurcation into the internal carotid artery.  I opened the carotid plaque anterior to facilitate identifying a re-entry point in the internal carotid artery.  At this point, I started the endarterectomy in the common carotid artery with a Cytogeneticistenfield dissector and carried this dissection down into the common carotid artery circumferentially.  Then I transected the plaque at a segment where it was adherent.  I then carried this dissection up into the external carotid artery.  The plaque was extracted by unclamping the external carotid artery and everting the artery.  The dissection was then carried into the internal carotid artery, extracting the remaining portion of the carotid plaque.  The plaque did not feather well into the distal internal carotid artery, leaving a ring of residual disease in the distal internal carotid artery.  I passed the plaque off the field as a specimen.  I spent additional time dissecting out the internal carotid artery more distally and reclamping.  I extended the arteriotomy with a Potts scissor.  I then carefully dissected out the ring of residual disease, taking care  to avoid tearing the wall.  Eventually, I was down to only residual wall and densely adherent intima.  I did not it was safe to do more extensive of an endarterectomy.    I then spent the next 30 minutes removing intimal flaps and loose debris.  Eventually I reached the point where the residual plaque was densely adherent and any further dissection would compromise the integrity of the wall.  After verifying that there was no more loose intimal flaps or debris, I re-interrogated the entirety of this carotid artery.  At this point, I was satisfied that the minimal remaining disease was densely adherent to the wall and wall integrity was intact.   At this point, I elected to repair this left carotid artery primarily due to its large size (10-12 mm).   I repaired this arteriotomy with a running stitch of 6-0 Prolene.  Prior to completing this arteriotomy, I backbled the external carotid artery and then reclamped it.  Back bleeding was: reasonable.   I then backbled the internal carotid artery and then reclamped it.  Back bleeding was: vigorous.   Finally, I backbled the common carotid artery and then reclamped it.  Back bleeding was: pulsatile.   I completed the repair in the usual fashion.  I gave 50 mg of Protamine to reverse anticoagulation.    At this point, I first released the clamp on the external carotid artery,  then I released it on the common carotid artery.  After waiting a few seconds, I then released it on the internal carotid artery.  I took the Sonosite probe and interrogated the carotid arteries.  There appeared to no flap in the common carotid artery but there appeared to be a non-flow limiting flap in the internal carotid artery.  I verified this on multiple scans and elected to re-explore the internal carotid artery.  I gave the patient another 8000 units of Heparin.  While waiting for the Heparin to load, I then interrogated this patient's arteries with the continuous Doppler.  The audible  waveforms in each artery were consistent with the expected characteristics for each artery.  This was obviously not consistent with the carotid duplex, so I repositioned the Sonosite posterior to the carotid artery bifurcation and reinterrogated the arteries.  This time it became evident that the flap was the external carotid artery not the internal carotid artery.  There was no residual flap in the internal carotid artery.  At this point, I washed out the wound, and placed Avitene throughout.  I also gave the patient 60 mg of protamine to reverse his anticoagulation.   After waiting a few minutes, I removed the Avitene and washed out the wound.  There was a slow ooze adjacent to the vagus nerve, so I felt I could not apply electrocautery.    There continued to be some bleeding which was not responsive to protamine and Avitene, so I felt placement of a drain was necessary.  I passed the TLS trocar through the subcutaneous tissue inferior to the incision line.  I pulled the drain to appropriate position and secured it with a 3-0 Nylon suture tied to the drain.  I shortened the drain to appropriate length and place it adjacent to the artery and nerve.  The exterior hardware was attached to the drain and the test tube was not attached until the skin was close.    I then reapproximated the platysma muscle with a running stitch of 3-0 Vicryl.  The skin was then reapproximated with a running subcuticular 4-0 Monocryl stitch.  The skin was then cleaned, dried and Dermabond was used to reinforce the skin closure.    The patient woke with essentially unchanged neurologic exam from preoperative status: right facial droops, right hand grip 0-1/5, and dorsoflexion 5/5.  Tongue position was improved from preoperatively which partially deviated, postoperative exam was midline tongue position.       COMPLICATIONS: none  CONDITION: stable   Leonides SakeBrian Zymir Napoli, MD, [facility] [place] Office:  [phone] Pager: [phone]  12/21/2017, 10:24 AM

## 2017-12-21 NOTE — Progress Notes (Signed)
Pt sts he is unable to urinate at this time 

## 2017-12-21 NOTE — Progress Notes (Signed)
Called into patient room by NT as pt was bleeding from L carotid endarterectomy site. Pressure held to site. Dr. Imogene Burnhen paged at 1:24PM. Rosey Batheresa RN at bedside to assist. RRT called. Lianne CureMaureen Collins PA paged, she was in the OR but said the team would be there shortly. Vitals stable. At 1330 BP 145/80 via automatic cuff. At 1345 BP 132/79. Dr. Imogene Burnhen and Arline Aspindy RN at bedside.   Leonidas Rombergaitlin S Bumbledare, RN

## 2017-12-21 NOTE — Anesthesia Procedure Notes (Signed)
Procedure Name: Intubation Date/Time: 12/21/2017 7:43 AM Performed by: Julieta Bellini, CRNA Pre-anesthesia Checklist: Patient identified, Emergency Drugs available, Suction available and Patient being monitored Patient Re-evaluated:Patient Re-evaluated prior to induction Oxygen Delivery Method: Circle system utilized Preoxygenation: Pre-oxygenation with 100% oxygen Induction Type: IV induction Ventilation: Mask ventilation without difficulty Laryngoscope Size: Mac and 4 Grade View: Grade I Tube type: Oral Tube size: 7.5 mm Number of attempts: 1 Airway Equipment and Method: Stylet Placement Confirmation: ETT inserted through vocal cords under direct vision,  positive ETCO2 and breath sounds checked- equal and bilateral Secured at: 23 cm Tube secured with: Tape Dental Injury: Teeth and Oropharynx as per pre-operative assessment

## 2017-12-21 NOTE — Transfer of Care (Signed)
Immediate Anesthesia Transfer of Care Note  Patient: Jimmy Morales  Procedure(s) Performed: LEFT CAROTID ENDARTERECTOMY (Left Neck)  Patient Location: PACU  Anesthesia Type:General  Level of Consciousness: awake, alert , oriented and patient cooperative  Airway & Oxygen Therapy: Patient Spontanous Breathing and Patient connected to nasal cannula oxygen  Post-op Assessment: Report given to RN, Post -op Vital signs reviewed and stable and Patient able to stick tongue midline, Right wrist weakness just the same as preop assessment  Post vital signs: Reviewed and stable  Last Vitals:  Vitals:   12/21/17 0601 12/21/17 1045  BP: (!) 165/77   Pulse: (!) 54   Resp: 18   Temp: 36.7 C (P) 36.8 C  SpO2: 100%     Last Pain:  Vitals:   12/21/17 0624  TempSrc:   PainSc: 0-No pain      Patients Stated Pain Goal: 3 (12/21/17 [id number])  Complications: No apparent anesthesia complications

## 2017-12-21 NOTE — Anesthesia Postprocedure Evaluation (Signed)
Anesthesia Post Note  Patient: Jimmy Morales  Procedure(s) Performed: LEFT CAROTID ENDARTERECTOMY (Left Neck)     Patient location during evaluation: PACU Anesthesia Type: General Level of consciousness: sedated Pain management: pain level controlled Vital Signs Assessment: post-procedure vital signs reviewed and stable Respiratory status: spontaneous breathing and respiratory function stable Cardiovascular status: stable Postop Assessment: no apparent nausea or vomiting Anesthetic complications: no    Last Vitals:  Vitals:   12/21/17 1100 12/21/17 1115  BP: 136/75 (!) 159/84  Pulse: 83 81  Resp: 17 18  Temp:    SpO2: 93% 97%    Last Pain:  Vitals:   12/21/17 1045  TempSrc:   PainSc: 0-No pain                 Deundra Bard DANIEL

## 2017-12-21 NOTE — Anesthesia Procedure Notes (Signed)
Arterial Line Insertion Start/End12/24/2018 7:14 AM, 12/21/2017 7:14 AM Performed by: Jed LimerickHarder, Blaire S, CRNA, CRNA  Patient location: Pre-op. Preanesthetic checklist: patient identified, IV checked, site marked, risks and benefits discussed, surgical consent, monitors and equipment checked, pre-op evaluation, timeout performed and anesthesia consent Lidocaine 1% used for infiltration Left, radial was placed Catheter size: 20 G Hand hygiene performed  and maximum sterile barriers used   Attempts: 1 Procedure performed without using ultrasound guided technique. Following insertion, dressing applied and Biopatch. Post procedure assessment: normal  Patient tolerated the procedure well with no immediate complications.

## 2017-12-21 NOTE — Progress Notes (Signed)
   12/21/17 0622  OBSTRUCTIVE SLEEP APNEA  Have you ever been diagnosed with sleep apnea through a sleep study? No  Do you snore loudly (loud enough to be heard through closed doors)?  1  Do you often feel tired, fatigued, or sleepy during the daytime (such as falling asleep during driving or talking to someone)? 0  Has anyone observed you stop breathing during your sleep? 1  Do you have, or are you being treated for high blood pressure? 1  BMI more than 35 kg/m2? 0  Age > 50 (1-yes) 1  Neck circumference greater than:Male 16 inches or larger, Male 17inches or larger? 1  Male Gender (Yes=1) 1  Obstructive Sleep Apnea Score 6  Score 5 or greater  Results sent to PCP

## 2017-12-21 NOTE — Progress Notes (Signed)
Patient arrived from PACU with left carotid endarterectomy with primary repair. Patient VS are B/P 131/81 HR 89 SAT 96. Patient has A Line 161/64. Patient is A&O and Patient denies pain. Patient is resting in bed.

## 2017-12-21 NOTE — Progress Notes (Signed)
   Patient sitting up in bedside chair, left CEA incision without hematoma. Drain site without active bleeding, clot formed around drain exit site.  Dry dressing placed over drain site. Patient at baseline.  Known facial droop without change.  S/P left CEA Stable condition  Plan D/C tomorrow.  Mosetta PigeonEmma Maureen Wynne Rozak PA-C

## 2017-12-22 LAB — CBC
HEMATOCRIT: 32.6 % — AB (ref 39.0–52.0)
HEMOGLOBIN: 11.4 g/dL — AB (ref 13.0–17.0)
MCH: 28.8 pg (ref 26.0–34.0)
MCHC: 35 g/dL (ref 30.0–36.0)
MCV: 82.3 fL (ref 78.0–100.0)
Platelets: 277 10*3/uL (ref 150–400)
RBC: 3.96 MIL/uL — AB (ref 4.22–5.81)
RDW: 12.6 % (ref 11.5–15.5)
WBC: 12.2 10*3/uL — ABNORMAL HIGH (ref 4.0–10.5)

## 2017-12-22 LAB — BASIC METABOLIC PANEL
Anion gap: 8 (ref 5–15)
BUN: 7 mg/dL (ref 6–20)
CHLORIDE: 108 mmol/L (ref 101–111)
CO2: 24 mmol/L (ref 22–32)
CREATININE: 1.19 mg/dL (ref 0.61–1.24)
Calcium: 9.1 mg/dL (ref 8.9–10.3)
GFR calc Af Amer: >60 mL/min (ref >60)
GFR calc non Af Amer: >60 mL/min (ref >60)
Glucose, Bld: 103 mg/dL — ABNORMAL HIGH (ref 65–99)
POTASSIUM: 3.8 mmol/L (ref 3.5–5.1)
Sodium: 140 mmol/L (ref 135–145)

## 2017-12-22 NOTE — Progress Notes (Signed)
Pt discharged home, per order. Pt received discharge instructions and all questions were answered. IV and telemetry box removed. Pt discharged with all of his belongings. Pt left via wheelchair and was accompanied by this RN and pt's family.   Berdine DanceLauren Moffitt BSN, RN

## 2017-12-22 NOTE — Progress Notes (Addendum)
[facility]  Subjective  - Doing well.   Objective 137/79 75 98.3 F (36.8 C) 17 98%  Intake/Output Summary (Last 24 hours) at 12/22/2017 0714 Last data filed at 12/22/2017 0343 Gross per 24 hour  Intake 1300 ml  Output 274 ml  Net 1026 ml     Right sided facial droop and weakness right UE  baseline pre-surgery. Left neck incision healing well without hematoma Palpable radial pulse equal B    Assessment/Planning: POD # 1 left CEA IS to bed side WBC 12.2, afebrile   D/C drain dry dressing over exit site Patient has ambulated, tolerating PO's well and voided. Discharge home in stable condition  Mosetta Pigeonmma Maureen Collins 12/22/2017 7:14 AM -- Agree with above.  Neuro at baseline.  Ok for d/c  Fabienne Brunsharles Carey Johndrow, MD [facility] [place] Office: [phone] Pager: [phone]  Laboratory Lab Results: Recent Labs    12/21/17 1205 12/22/17 0339  WBC 11.7* 12.2*  HGB 12.5* 11.4*  HCT 35.1* 32.6*  PLT 266 277   BMET Recent Labs    12/21/17 1205 12/22/17 0339  NA  --  140  K  --  3.8  CL  --  108  CO2  --  24  GLUCOSE  --  103*  BUN  --  7  CREATININE 1.29* 1.19  CALCIUM  --  9.1    COAG Lab Results  Component Value Date   INR 1.07 12/15/2017   No results found for: PTT

## 2017-12-24 ENCOUNTER — Telehealth: Payer: Self-pay | Admitting: Vascular Surgery

## 2017-12-24 NOTE — Telephone Encounter (Signed)
-----   Message from Sharee PimpleMarilyn K McChesney, RN sent at 12/21/2017 12:36 PM EST ----- Regarding: 2 weeks   ----- Message ----- From: Lars Mageollins, Emma M, PA-C Sent: 12/21/2017  11:02 AM To: [facility] Charge Pool  F/U with Dr. Imogene Burnhen in 2 weeks s/p left CEA

## 2017-12-24 NOTE — Telephone Encounter (Signed)
Spoke to spouse and patient states he will be seeing [facility] 1/15 and would like to continue his care with them at this time. Making sure you are ok with this process?

## 2017-12-24 NOTE — Telephone Encounter (Signed)
-----   Message from Renato GailsJennifer L Arrington sent at 12/23/2017  9:27 AM EST ----- Regarding: Post op with Dr. Imogene Burnhen 2 weeks   ----- Message ----- From: Fransisco Hertzhen, Brian L, MD Sent: 12/21/2017  11:18 AM To: Vvs Charge Pool  Tomasita CrumbleDavid B Garling [id number] 01/20/1961  PROCEDURE:   1.  left carotid endarterectomy with primary repair 2.  left intraoperative carotid ultrasound  Asst: Lianne CureMaureen Collins, PAC   Follow-up: 2 weeks.

## 2017-12-24 NOTE — Telephone Encounter (Signed)
appt for 01/15/18 pt aware and mailed letter 12/24/17 bg

## 2017-12-25 ENCOUNTER — Encounter (HOSPITAL_COMMUNITY): Payer: Self-pay | Admitting: Vascular Surgery

## 2017-12-30 NOTE — Discharge Summary (Signed)
[facility] Discharge Summary   Patient ID:  Jimmy Morales MRN: [id number] DOB/AGE: 08/06/1961 57 y.o.  Admit date: 12/21/2017 Discharge date: 12/22/2017 Date of Surgery: 12/21/2017 Surgeon: Surgeon(s): Fransisco Hertz, MD  Admission Diagnosis: Carotid stenosis [I65.29]  Discharge Diagnoses:  Carotid stenosis [I65.29]  Secondary Diagnoses: Past Medical History:  Diagnosis Date  . Arthritis    "knees, ankles, back" (12/21/2017)  . Carotid stenosis, left   . Childhood asthma   . Chronic lower back pain   . High cholesterol   . History of gout   . Hypertension   . Left carotid stenosis    hx/notes 12/21/2017  . Stroke [facility]) 11/2017   "slurred speech problems right sided weakness since" (12/21/2017)    Procedure(s): LEFT CAROTID ENDARTERECTOMY  Discharged Condition: stable  HPI: 57 Y/O male with sudden on set of right right UE weakness and slurred speech due to right sided facial droop was brought to the ED on 12/15/2017.  MRI and MRA brain was obtained which showed left frontal infarct as well as punctate bilateral parietal infarcts.  CTA of the neck revealed left ICA stenosis of 90%.  The patient did receive TPA at 12 am  We are consult for carotid stenosis.             Past medical history includes HTN managed with Lisinopril/HCTZ, hyperlipidemia that was managed with Crestor, but it was discontinued reason not known.  He does take a daily 81 mg aspirin.  He reports no history of CVA or CAD.  He is a chronic tobacco abuser.  The patient was discharged home on 12/18/2017 by Dr Roda Shutters.  Plan to return to [facility] for left CEA on 12/21/2017.     Hospital Course:  Jimmy Morales is a 57 y.o. male is S/P Procedure(s): LEFT CAROTID ENDARTERECTOMY  Post op day #1   Right sided facial droop and weakness right UE  baseline pre-surgery. Left neck incision healing well without hematoma Palpable radial pulse equal B D/C home in stable  condition.     Significant Diagnostic Studies: CBC Lab Results  Component Value Date   WBC 12.2 (H) 12/22/2017   HGB 11.4 (L) 12/22/2017   HCT 32.6 (L) 12/22/2017   MCV 82.3 12/22/2017   PLT 277 12/22/2017    BMET    Component Value Date/Time   NA 140 12/22/2017 0339   K 3.8 12/22/2017 0339   CL 108 12/22/2017 0339   CO2 24 12/22/2017 0339   GLUCOSE 103 (H) 12/22/2017 0339   BUN 7 12/22/2017 0339   CREATININE 1.19 12/22/2017 0339   CALCIUM 9.1 12/22/2017 0339   GFRNONAA >60 12/22/2017 0339   GFRAA >60 12/22/2017 0339   COAG Lab Results  Component Value Date   INR 1.07 12/15/2017     Disposition:  Discharge to :Home Discharge Instructions    Call MD for:  redness, tenderness, or signs of infection (pain, swelling, bleeding, redness, odor or green/yellow discharge around incision site)   Complete by:  As directed    Call MD for:  severe or increased pain, loss or decreased feeling  in affected limb(s)   Complete by:  As directed    Call MD for:  temperature >100.5   Complete by:  As directed    Discharge instructions   Complete by:  As directed    You may shower daily   Discharge patient   Complete by:  As directed    Discharge disposition:  01-Home  or Self Care   Discharge patient date:  12/22/2017   Resume previous diet   Complete by:  As directed      Allergies as of 12/22/2017      Reactions   Shellfish Allergy Shortness Of Breath, Swelling      Medication List    TAKE these medications   acetaminophen 500 MG tablet Commonly known as:  TYLENOL Take 500 mg by mouth every 6 (six) hours as needed.   aspirin 325 MG EC tablet Take 1 tablet (325 mg total) by mouth daily.   atorvastatin 80 MG tablet Commonly known as:  LIPITOR Take 1 tablet (80 mg total) by mouth daily at 6 PM.   Cholecalciferol 1000 units capsule Take 1,000 Units by mouth daily.   oxyCODONE 5 MG immediate release tablet Commonly known as:  ROXICODONE Take 1 tablet (5 mg  total) by mouth every 6 (six) hours as needed.      Verbal and written Discharge instructions given to the patient. Wound care per Discharge AVS Follow-up Information    Fransisco Hertzhen, Kindel Rochefort L, MD Follow up in 2 week(s).   Specialties:  Vascular Surgery, Cardiology Why:  Office will call you to arrange your appt (sent) Contact information: [address] [place] [id number] [phone]           Signed: Mosetta Pigeonmma Maureen Collins 12/30/2017, 12:41 PM   Addendum  Jimmy Morales is a 57 y.o. (10/14/1961) male underwent left carotid endarterectomy for sub-total occlusion result in neuro deficit causing Left CVA. The patient's intraoperative findings were consistent with chronic sub-total vs total occlusion.  Post-operatively the patient neuro exam was improved from preoperatively with some improved right hand grip.  The patient had no hematoma and was discharged with stable hemodynamics.  He will follow up for wound check in 2 weeks.   Leonides SakeBrian Clydie Dillen, MD, FACS [facility] [place] Office: [phone] Pager: [phone]  12/30/2017, 1:08 PM    --- For VQI Registry use --- Instructions: Press F2 to tab through selections.  Delete question if not applicable.     Modified Rankin score at D/C (0-6): Rankin Score=2  IV medication needed for:  1. Hypertension: No 2. Hypotension: No  Post-op Complications: No  1. Post-op CVA or TIA: No  If yes: Event classification (right eye, left eye, right cortical, left cortical, verterobasilar, other):   If yes: Timing of event (intra-op, <6 hrs post-op, >=6 hrs post-op, unknown):   2. CN injury: No  If yes: CN  injuried   3. Myocardial infarction: No  If yes: Dx by (EKG or clinical, Troponin):   4.  CHF: No  5.  Dysrhythmia (new): No  6. Wound infection: No  7. Reperfusion symptoms: No  8. Return to OR: No  If yes: return to OR for (bleeding, neurologic, other CEA incision, other):   Discharge  medications: Statin use:  Yes ASA use:  Yes Beta blocker use:  No  for medical reason   ACE-Inhibitor use:  No  for medical reason   P2Y12 Antagonist use: [x ] None, [ ]  Plavix, [ ]  Plasugrel, [ ]  Ticlopinine, [ ]  Ticagrelor, [ ]  Other, [ ]  No for medical reason, [ ]  Non-compliant, [ ]  Not-indicated Anti-coagulant use:  [x ] None, [ ]  Warfarin, [ ]  Rivaroxaban, [ ]  Dabigatran, [ ]  Other, [ ]  No for medical reason, [ ]  Non-compliant, [ ]  Not-indicated

## 2018-01-14 NOTE — Progress Notes (Deleted)
    Postoperative Visit   History of Present Illness   Jimmy Morales is a 57 y.o. male who presents for postoperative follow-up for: left CEA for L sx carotid stenosis >90% (Date: 12/21/17).  The patient's neck incision is *** healed.  The patient has had *** stroke or TIA symptoms.  Current Outpatient Medications  Medication Sig Dispense Refill  . acetaminophen (TYLENOL) 500 MG tablet Take 500 mg by mouth every 6 (six) hours as needed.    Marland Kitchen. aspirin EC 325 MG EC tablet Take 1 tablet (325 mg total) by mouth daily. 30 tablet 5  . atorvastatin (LIPITOR) 80 MG tablet Take 1 tablet (80 mg total) by mouth daily at 6 PM. 30 tablet 5  . Cholecalciferol 1000 units capsule Take 1,000 Units by mouth daily.    Marland Kitchen. oxyCODONE (ROXICODONE) 5 MG immediate release tablet Take 1 tablet (5 mg total) by mouth every 6 (six) hours as needed. 6 tablet 0   No current facility-administered medications for this visit.     For VQI Use Only   PRE-ADM LIVING: {VQI Pre-admission Living:20973}  AMB STATUS: {VQI Ambulatory Status:20974}   Physical Examination  ***There were no vitals filed for this visit.  left Neck: Incision is *** healed  Neuro: CN 2-12 are intact *** except ***, Motor strength is ***/5 *** bilaterally, sensation is *** grossly intact   Medical Decision Making   Jimmy Morales is a 57 y.o. male who presents s/p left CEA.   The patient's neck incision is *** healing with no stroke symptoms. I discussed in depth with the patient the nature of atherosclerosis, and emphasized the importance of maximal medical management including strict control of blood pressure, blood glucose, and lipid levels, obtaining regular exercise, anti-platelet use and cessation of smoking.   The patient is currently on a statin: Lipitor.  The patient is currently on an anti-platelet: ASA. The patient is aware that without maximal medical management the underlying atherosclerotic disease process will progress,  limiting the benefit of any interventions. The patient's surveillance will included routine carotid duplex studies which will be completed in: 9 months, at which time the patient will be re-evaluated.   I emphasized the importance of routine surveillance of the carotid arteries as recurrence of stenosis is possible, especially with proper management of underlying atherosclerotic disease. The patient agrees to participate in their maximal medical care and routine surveillance.  Thank you for allowing us to participate in this patient's care.  Leonides SakeBrian Amulya Quintin, MD, FACS [facility] [place] Office: [phone] Pager: [phone]

## 2018-01-15 ENCOUNTER — Encounter: Payer: Self-pay | Admitting: Vascular Surgery

## 2018-03-30 ENCOUNTER — Other Ambulatory Visit: Payer: Self-pay

## 2018-04-01 ENCOUNTER — Other Ambulatory Visit: Payer: Self-pay

## 2018-04-02 ENCOUNTER — Other Ambulatory Visit: Payer: Self-pay

## 2018-04-02 NOTE — Patient Outreach (Signed)
First/Second/Third attempt to obtain mRS. No answer. Left message for returned call. 7  

## 2019-01-19 IMAGING — MR MR HEAD W/O CM
11 of 13 series · 26 of 48 positions shown · non-contrast
Comparison: Head CT 12/15/2017

CLINICAL DATA: Acute onset facial droop, dysarthria and right upper
extremity numbness.



[Series 3: DWI · axial · 3.0mm · 0.94mm/px · z∈[-17,+129]mm · 5 of 100 slices shown (1 of 2)]
[im 1/100]
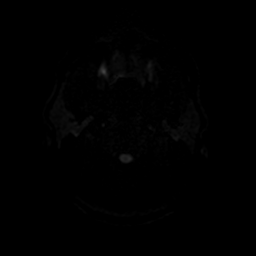
[im 25/100]
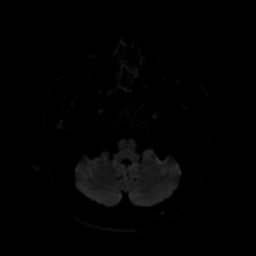
[im 50/100]
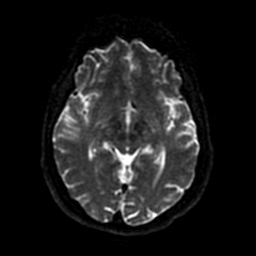
[im 75/100]
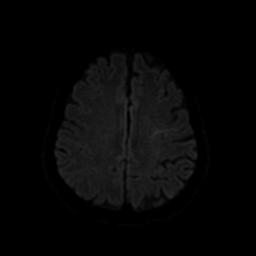
[im 100/100]
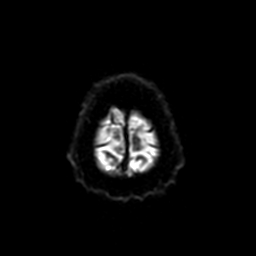

[Series 4: ax (id) 2 · axial · 1.0mm · 0.43mm/px · z∈[+9,+100]mm · 8 of 184 slices shown]
[im 1/184]
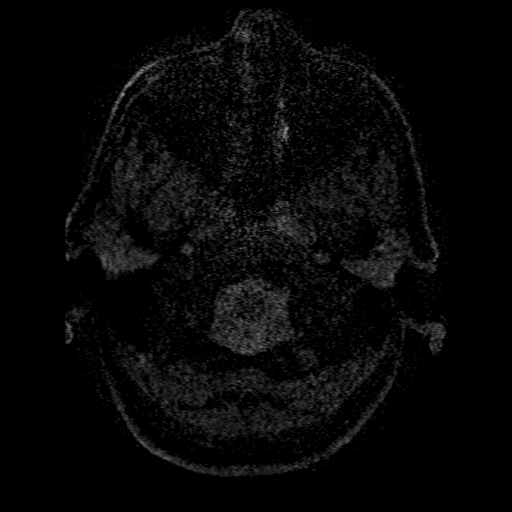
[im 27/184]
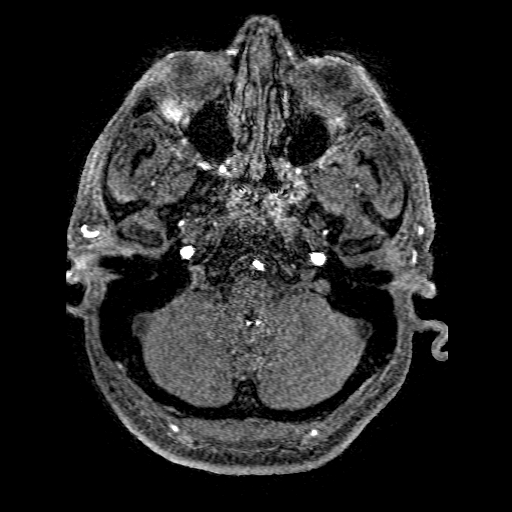
[im 53/184]
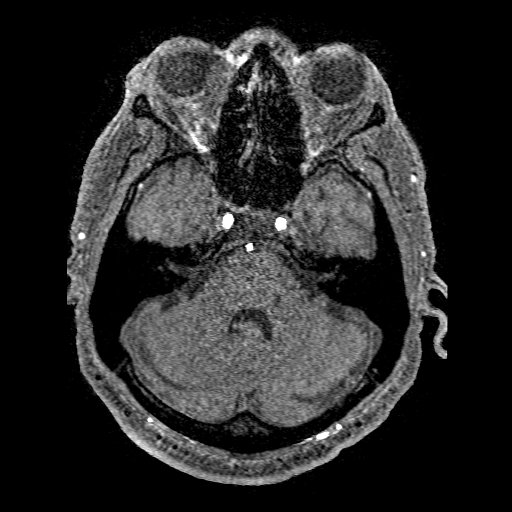
[im 79/184]
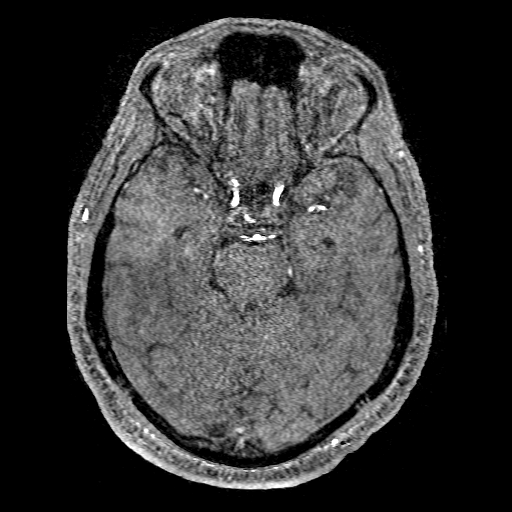
[im 105/184]
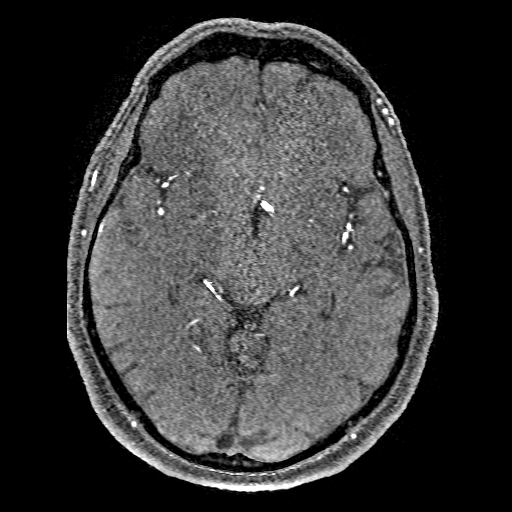
[im 131/184]
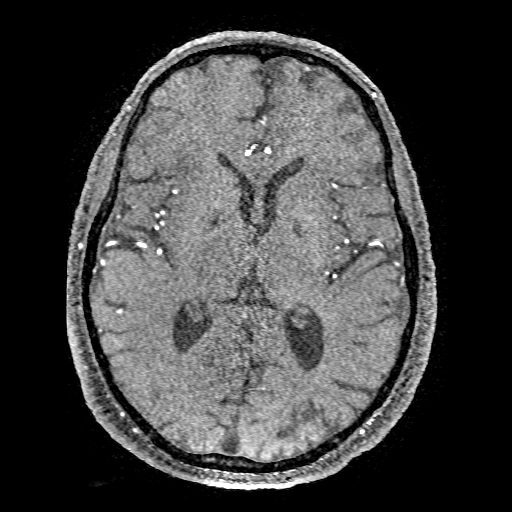
[im 157/184]
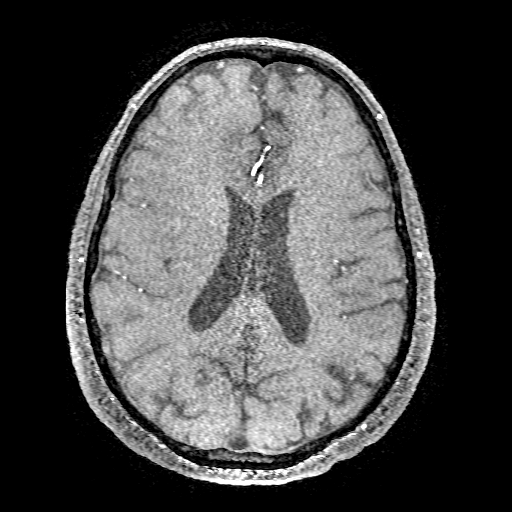
[im 184/184]
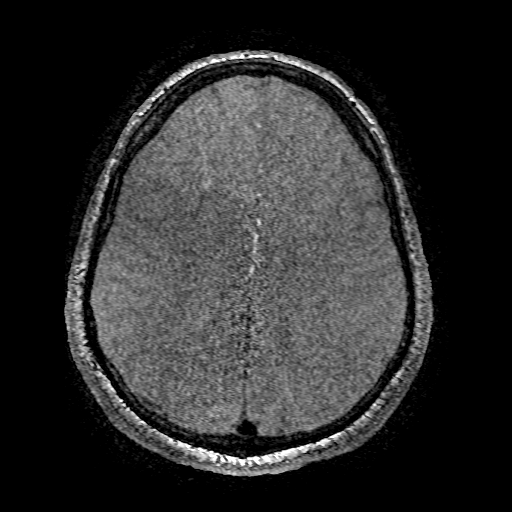

[Series 5: DWI · coronal · 4.0mm · 0.94mm/px · 3 of 74 slices shown (2 of 2)]
[im 1/74]
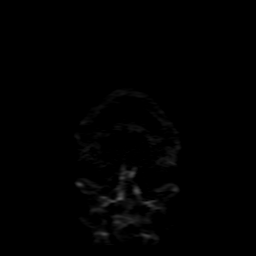
[im 37/74]
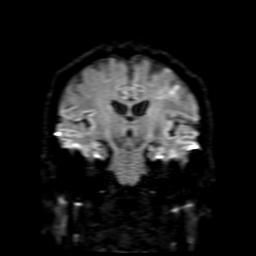
[im 74/74]
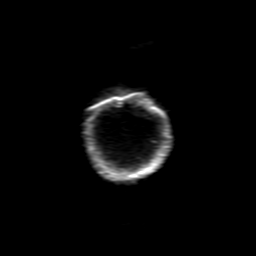

[Series 6: FLAIR · sagittal · 5.0mm · 0.47mm/px · 1 of 24 slices shown (1 of 2)]
[im 1/24]
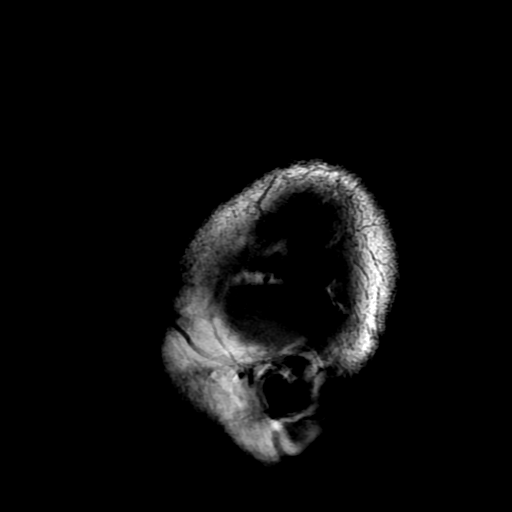

[Series 7: T2 · axial · 5.0mm · 0.43mm/px · 1 of 27 slices shown (1 of 2)]
[im 1/27]
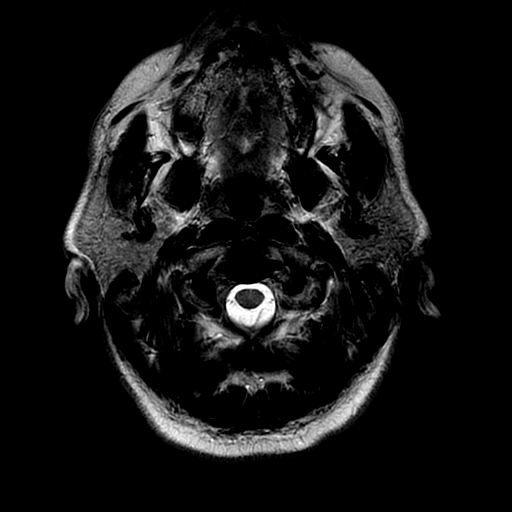

[Series 8: FLAIR · axial · 5.0mm · 0.43mm/px · 1 of 27 slices shown (2 of 2)]
[im 1/27]
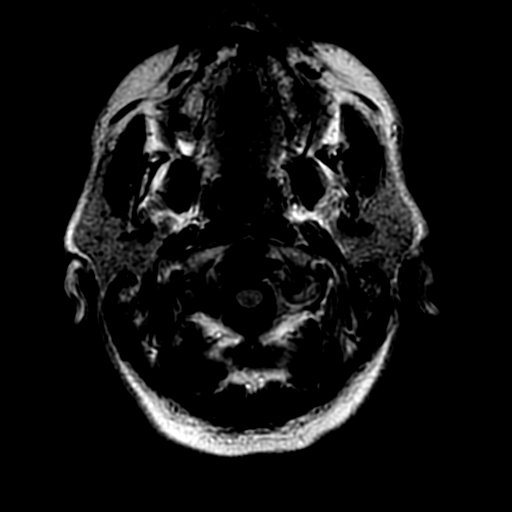

[Series 9: GRE · axial · 5.0mm · 0.43mm/px · 1 of 27 slices shown]
[im 1/27]
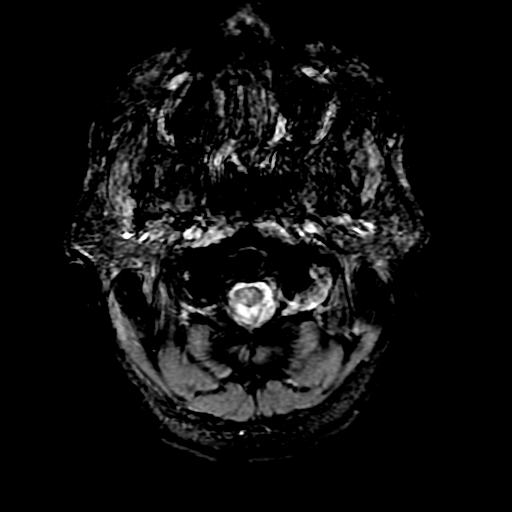

[Series 10: T1 · axial · 5.0mm · 0.43mm/px · 1 of 27 slices shown]
[im 1/27]
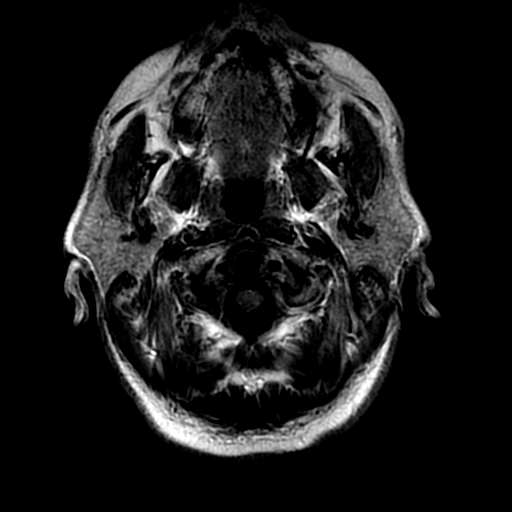

[Series 15: T2 · coronal · 5.0mm · 0.47mm/px · 1 of 31 slices shown (2 of 2)]
[im 1/31]
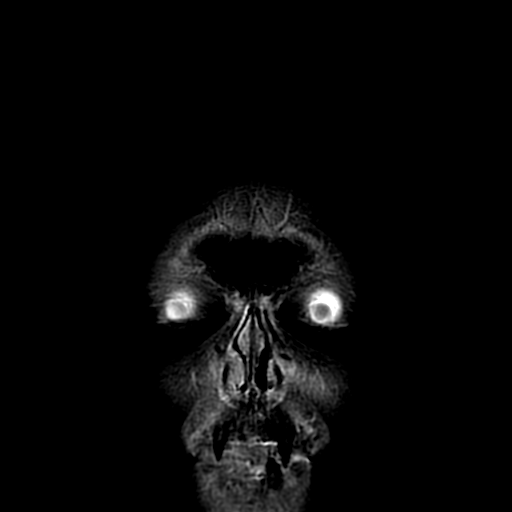

[Series 350: ADC · axial · 3.0mm · 0.94mm/px · z∈[-17,+129]mm · 2 of 49 slices shown (1 of 2)]
[im 1/49]
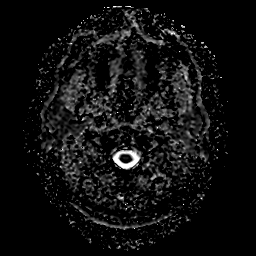
[im 49/49]
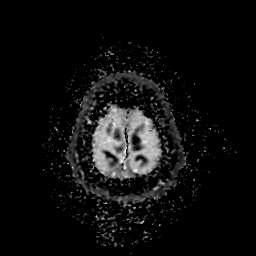

[Series 550: ADC · coronal · 4.0mm · 0.94mm/px · 2 of 37 slices shown (2 of 2)]
[im 1/37]
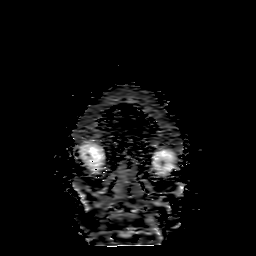
[im 37/37]
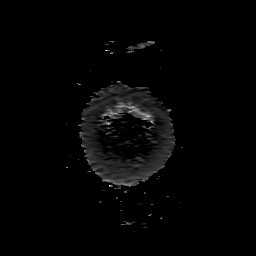

[26 of 48 positions shown; findings below may reference images not displayed]

FINDINGS: MRI HEAD FINDINGS

Brain: The midline structures are normal. There is a small focus of
diffusion restriction pain in the peripheral left frontal lobe,
along the precentral gyrus. There are additional punctate foci
within both superior parietal lobes. There are old infarcts of the
left frontal lobe left parietal lobe. No mass lesion. No chronic
microhemorrhage or cerebral amyloid angiopathy. No hydrocephalus,
age advanced atrophy or lobar predominant volume loss. No dural
abnormality or extra-axial collection.

Skull and upper cervical spine: The visualized skull base,
calvarium, upper cervical spine and extracranial soft tissues are
normal.

Sinuses/Orbits: No fluid levels or advanced mucosal thickening. No
mastoid effusion. Normal orbits.

MRA HEAD FINDINGS

Intracranial internal carotid arteries: Normal.

Anterior cerebral arteries: The azygos variant anterior cerebral
artery, arising from the left A1 segment. There is a diminutive
right A1 segment.

Middle cerebral arteries: There is a short segment focal stenosis of
the distal M 1 segment of the left middle cerebral artery. The right
MCA is normal.

Posterior communicating arteries: Present bilaterally.

Posterior cerebral arteries: Normal.

Basilar artery: Normal.

Vertebral arteries: Codominant. Normal.

Superior cerebellar arteries: Normal.

Anterior inferior cerebellar arteries: Normal.

Posterior inferior cerebellar arteries: Normal.

MRA NECK FINDINGS

Right carotid system: Normal course and caliber without stenosis or
evidence of dissection.

Left carotid system: There is loss of the normal flow related
enhancement at the origin of the left internal carotid artery,
extending over a short segment. There is present flow related
enhancement of the distal left ICA.

Vertebral arteries: Codominant. Vertebral artery origins are normal.
Vertebral arteries are normal in course and caliber to the
vertebrobasilar confluence without stenosis or evidence of
dissection.
IMPRESSION: 1. Multiple punctate foci of acute ischemia within the left frontal
lobe and bilateral superior parietal lobes. The largest area of
ischemia is along the left precentral gyrus. No hemorrhage or mass
effect.
2. Loss of the normal flow related enhancement is of the proximal
left internal carotid artery, likely indicating occlusion or
critical stenosis. MR angiography may overestimate the degree of
stenosis. CT angiography or conventional angiography may provide
more accurate assessment.
3. Old left frontal and parietal lobe infarcts.
Critical Value/emergent results were called by telephone at the time
of interpretation on 12/16/2017 at [DATE] to Dr. VIEJO PAINTING ,
who verbally acknowledged these results.
